# Patient Record
Sex: Male | Born: 1945 | Race: Black or African American | Hispanic: No | Marital: Married | Smoking: Former smoker
Health system: Southern US, Community
[De-identification: ages and names within clinical notes are randomized; demographics above are authoritative.]

## PROBLEM LIST (undated history)

## (undated) DIAGNOSIS — C61 Malignant neoplasm of prostate: Secondary | ICD-10-CM

## (undated) DIAGNOSIS — E785 Hyperlipidemia, unspecified: Secondary | ICD-10-CM

## (undated) DIAGNOSIS — R7303 Prediabetes: Secondary | ICD-10-CM

## (undated) DIAGNOSIS — K219 Gastro-esophageal reflux disease without esophagitis: Secondary | ICD-10-CM

## (undated) DIAGNOSIS — Z973 Presence of spectacles and contact lenses: Secondary | ICD-10-CM

## (undated) HISTORY — PX: BACK SURGERY: SHX140

## (undated) HISTORY — PX: OTHER SURGICAL HISTORY: SHX169

---

## 1995-04-22 HISTORY — PX: BACK SURGERY: SHX140

## 2001-07-19 ENCOUNTER — Encounter: Payer: Self-pay | Admitting: Internal Medicine

## 2001-07-19 ENCOUNTER — Encounter: Admission: RE | Admit: 2001-07-19 | Discharge: 2001-07-19 | Payer: Self-pay | Admitting: Internal Medicine

## 2002-07-01 ENCOUNTER — Ambulatory Visit (HOSPITAL_COMMUNITY): Admission: RE | Admit: 2002-07-01 | Discharge: 2002-07-01 | Payer: Self-pay | Admitting: Gastroenterology

## 2015-04-22 DIAGNOSIS — Z87442 Personal history of urinary calculi: Secondary | ICD-10-CM

## 2015-04-22 DIAGNOSIS — N200 Calculus of kidney: Secondary | ICD-10-CM

## 2015-04-22 DIAGNOSIS — N281 Cyst of kidney, acquired: Secondary | ICD-10-CM

## 2015-04-22 HISTORY — DX: Cyst of kidney, acquired: N28.1

## 2015-04-22 HISTORY — DX: Personal history of urinary calculi: Z87.442

## 2015-04-22 HISTORY — DX: Calculus of kidney: N20.0

## 2015-06-27 ENCOUNTER — Emergency Department (HOSPITAL_COMMUNITY): Payer: Federal, State, Local not specified - PPO

## 2015-06-27 ENCOUNTER — Emergency Department (HOSPITAL_COMMUNITY)
Admission: EM | Admit: 2015-06-27 | Discharge: 2015-06-27 | Disposition: A | Discharge status: Home / Self Care | Payer: Federal, State, Local not specified - PPO | Attending: Emergency Medicine | Admitting: Emergency Medicine

## 2015-06-27 ENCOUNTER — Encounter (HOSPITAL_COMMUNITY): Payer: Self-pay

## 2015-06-27 DIAGNOSIS — Z79899 Other long term (current) drug therapy: Secondary | ICD-10-CM | POA: Diagnosis not present

## 2015-06-27 DIAGNOSIS — Z7982 Long term (current) use of aspirin: Secondary | ICD-10-CM | POA: Diagnosis not present

## 2015-06-27 DIAGNOSIS — R1032 Left lower quadrant pain: Secondary | ICD-10-CM | POA: Diagnosis present

## 2015-06-27 DIAGNOSIS — N200 Calculus of kidney: Secondary | ICD-10-CM | POA: Insufficient documentation

## 2015-06-27 DIAGNOSIS — N23 Unspecified renal colic: Secondary | ICD-10-CM

## 2015-06-27 DIAGNOSIS — N1330 Unspecified hydronephrosis: Secondary | ICD-10-CM | POA: Diagnosis not present

## 2015-06-27 LAB — CBC WITH DIFFERENTIAL/PLATELET
Basophils Absolute: 0 10*3/uL (ref 0.0–0.1)
Basophils Relative: 0 %
EOS ABS: 0 10*3/uL (ref 0.0–0.7)
EOS PCT: 0 %
HCT: 47.4 % (ref 39.0–52.0)
Hemoglobin: 15.4 g/dL (ref 13.0–17.0)
LYMPHS ABS: 1.6 10*3/uL (ref 0.7–4.0)
LYMPHS PCT: 11 %
MCH: 29.9 pg (ref 26.0–34.0)
MCHC: 32.5 g/dL (ref 30.0–36.0)
MCV: 92 fL (ref 78.0–100.0)
MONO ABS: 1.1 10*3/uL — AB (ref 0.1–1.0)
MONOS PCT: 7 %
Neutro Abs: 12.7 10*3/uL — ABNORMAL HIGH (ref 1.7–7.7)
Neutrophils Relative %: 82 %
PLATELETS: 210 10*3/uL (ref 150–400)
RBC: 5.15 MIL/uL (ref 4.22–5.81)
RDW: 14.6 % (ref 11.5–15.5)
WBC: 15.5 10*3/uL — AB (ref 4.0–10.5)

## 2015-06-27 LAB — I-STAT CG4 LACTIC ACID, ED
LACTIC ACID, VENOUS: 5.22 mmol/L — AB (ref 0.5–2.0)
Lactic Acid, Venous: 2.96 mmol/L (ref 0.5–2.0)

## 2015-06-27 LAB — BASIC METABOLIC PANEL
ANION GAP: 11 (ref 5–15)
BUN: 18 mg/dL (ref 6–20)
CO2: 22 mmol/L (ref 22–32)
Calcium: 9.3 mg/dL (ref 8.9–10.3)
Chloride: 107 mmol/L (ref 101–111)
Creatinine, Ser: 1.75 mg/dL — ABNORMAL HIGH (ref 0.61–1.24)
GFR calc Af Amer: 44 mL/min — ABNORMAL LOW (ref >60)
GFR, EST NON AFRICAN AMERICAN: 38 mL/min — AB (ref >60)
Glucose, Bld: 170 mg/dL — ABNORMAL HIGH (ref 65–99)
POTASSIUM: 3.8 mmol/L (ref 3.5–5.1)
SODIUM: 140 mmol/L (ref 135–145)

## 2015-06-27 LAB — PROTIME-INR
INR: 1 (ref 0.00–1.49)
PROTHROMBIN TIME: 13.4 s (ref 11.6–15.2)

## 2015-06-27 LAB — URINALYSIS, ROUTINE W REFLEX MICROSCOPIC
Bilirubin Urine: NEGATIVE
Glucose, UA: NEGATIVE mg/dL
Ketones, ur: NEGATIVE mg/dL
LEUKOCYTES UA: NEGATIVE
Nitrite: NEGATIVE
PROTEIN: NEGATIVE mg/dL
Specific Gravity, Urine: 1.036 — ABNORMAL HIGH (ref 1.005–1.030)
pH: 5 (ref 5.0–8.0)

## 2015-06-27 LAB — URINE MICROSCOPIC-ADD ON

## 2015-06-27 MED ORDER — FENTANYL CITRATE (PF) 100 MCG/2ML IJ SOLN
50.0000 ug | Freq: Once | INTRAMUSCULAR | Status: AC
  Administered 2015-06-27: 50 ug via INTRAVENOUS
  Filled 2015-06-27: qty 2

## 2015-06-27 MED ORDER — ONDANSETRON HCL 4 MG/2ML IJ SOLN
4.0000 mg | Freq: Once | INTRAMUSCULAR | Status: AC
  Administered 2015-06-27: 4 mg via INTRAVENOUS
  Filled 2015-06-27: qty 2

## 2015-06-27 MED ORDER — IOHEXOL 300 MG/ML  SOLN
25.0000 mL | Freq: Once | INTRAMUSCULAR | Status: AC | PRN
  Administered 2015-06-27: 25 mL via ORAL

## 2015-06-27 MED ORDER — SODIUM CHLORIDE 0.9 % IV BOLUS (SEPSIS): 1000.0000 mL | INTRAVENOUS | Status: AC

## 2015-06-27 MED ORDER — SODIUM CHLORIDE 0.9 % IV BOLUS (SEPSIS)
1000.0000 mL | Freq: Once | INTRAVENOUS | Status: AC
  Administered 2015-06-27: 1000 mL via INTRAVENOUS

## 2015-06-27 MED ORDER — TAMSULOSIN HCL 0.4 MG PO CAPS: 0.4000 mg | ORAL_CAPSULE | Freq: Every day | ORAL | Status: DC

## 2015-06-27 MED ORDER — HYDROCODONE-ACETAMINOPHEN 5-325 MG PO TABS: 1.0000 | ORAL_TABLET | Freq: Four times a day (QID) | ORAL | Status: DC | PRN

## 2015-06-27 MED ORDER — SODIUM CHLORIDE 0.9 % IV BOLUS (SEPSIS): 500.0000 mL | INTRAVENOUS | Status: AC

## 2015-06-27 MED ORDER — ONDANSETRON 8 MG PO TBDP: 8.0000 mg | ORAL_TABLET | Freq: Three times a day (TID) | ORAL | Status: DC | PRN

## 2015-06-27 MED ORDER — NAPROXEN 500 MG PO TABS: 500.0000 mg | ORAL_TABLET | Freq: Two times a day (BID) | ORAL | Status: DC

## 2015-06-27 MED ORDER — IOHEXOL 300 MG/ML  SOLN
80.0000 mL | Freq: Once | INTRAMUSCULAR | Status: AC | PRN
  Administered 2015-06-27: 80 mL via INTRAVENOUS

## 2015-06-27 MED ORDER — KETOROLAC TROMETHAMINE 15 MG/ML IJ SOLN
15.0000 mg | Freq: Once | INTRAMUSCULAR | Status: AC
  Administered 2015-06-27: 15 mg via INTRAVENOUS
  Filled 2015-06-27: qty 1

## 2015-06-27 NOTE — ED Notes (Signed)
EDP CANCELLED CODE SEPSIS

## 2015-06-27 NOTE — ED Provider Notes (Addendum)
CSN: [id number]     Arrival date & time 06/27/15  0700 History   First MD Initiated Contact with Patient 06/27/15 [phone]     Chief Complaint  Patient presents with  . Flank Pain     (Consider location/radiation/quality/duration/timing/severity/associated sxs/prior Treatment) HPI Comments: 70 y.o male with no significant medical history comes in with cc of abdominal pain and back pain. Pt's pain started at 5 am. Pain is dull, but constant and moderately severe. Pain is located in the LLQ and radiates to the back. No pain in the groin/scrotal region. No hx of similar pain, no hx of renal stones. Pt denies diarrhea, uti like sx. + nausea and emesis. No hx of diverticular dz that we are aware of.   ROS 10 Systems reviewed and are negative for acute change except as noted in the HPI.     Patient is a 70 y.o. male presenting with flank pain. The history is provided by the patient.  Flank Pain    History reviewed. No pertinent past medical history. History reviewed. No pertinent past surgical history. History reviewed. No pertinent family history. Social History  Substance Use Topics  . Smoking status: Never Smoker   . Smokeless tobacco: None  . Alcohol Use: No    Review of Systems  Genitourinary: Positive for flank pain.  All other systems reviewed and are negative.     Allergies  Review of patient's allergies indicates no known allergies.  Home Medications   Prior to Admission medications   Medication Sig Start Date End Date Taking? Authorizing Provider  aspirin EC 325 MG tablet Take 325 mg by mouth daily.   Yes Historical Provider, MD  atorvastatin (LIPITOR) 40 MG tablet Take 40 mg by mouth daily. 06/18/15  Yes Historical Provider, MD  naproxen sodium (ANAPROX) 220 MG tablet Take 440 mg by mouth 2 (two) times daily as needed (pain).   Yes Historical Provider, MD  psyllium (METAMUCIL) 58.6 % packet Take 1 packet by mouth daily.   Yes Historical Provider, MD  Saw Palmetto,  Serenoa repens, (SAW PALMETTO PO) Take 2 tablets by mouth daily.   Yes Historical Provider, MD  HYDROcodone-acetaminophen (NORCO/VICODIN) 5-325 MG tablet Take 1 tablet by mouth every 6 (six) hours as needed. 06/27/15   Varney Biles, MD  naproxen (NAPROSYN) 500 MG tablet Take 1 tablet (500 mg total) by mouth 2 (two) times daily with a meal. 06/27/15   Varney Biles, MD  ondansetron (ZOFRAN ODT) 8 MG disintegrating tablet Take 1 tablet (8 mg total) by mouth every 8 (eight) hours as needed for nausea. 06/27/15   Varney Biles, MD  tamsulosin (FLOMAX) 0.4 MG CAPS capsule Take 1 capsule (0.4 mg total) by mouth daily. 06/27/15   Williemae Muriel Kathrynn Humble, MD   BP 127/62 mmHg  Pulse 83  Temp(Src) 97.8 F (36.6 C) (Oral)  Resp 20  Ht 5\' 11"  (1.803 m)  Wt 178 lb (80.74 kg)  BMI 24.84 kg/m2  SpO2 97% Physical Exam  Constitutional: He is oriented to person, place, and time. He appears well-developed.  HENT:  Head: Normocephalic and atraumatic.  Eyes: Conjunctivae and EOM are normal. Pupils are equal, round, and reactive to light.  Neck: Normal range of motion. Neck supple.  Cardiovascular: Normal rate, regular rhythm and normal heart sounds.   Pulmonary/Chest: Effort normal and breath sounds normal. No respiratory distress. He has no wheezes.  Abdominal: Soft. Bowel sounds are normal. He exhibits no distension. There is tenderness. There is guarding. There is no rebound.  LLQ tenderness  Genitourinary:  Testes descended and free moving, no hernia  Neurological: He is alert and oriented to person, place, and time.  Skin: Skin is warm.  Nursing note and vitals reviewed.   ED Course  Procedures (including critical care time) Labs Review Labs Reviewed  CBC WITH DIFFERENTIAL/PLATELET - Abnormal; Notable for the following:    WBC 15.5 (*)    Neutro Abs 12.7 (*)    Monocytes Absolute 1.1 (*)    All other components within normal limits  BASIC METABOLIC PANEL - Abnormal; Notable for the following:     Glucose, Bld 170 (*)    Creatinine, Ser 1.75 (*)    GFR calc non Af Amer 38 (*)    GFR calc Af Amer 44 (*)    All other components within normal limits  URINALYSIS, ROUTINE W REFLEX MICROSCOPIC (NOT AT [facility]) - Abnormal; Notable for the following:    Specific Gravity, Urine 1.036 (*)    Hgb urine dipstick LARGE (*)    All other components within normal limits  URINE MICROSCOPIC-ADD ON - Abnormal; Notable for the following:    Squamous Epithelial / LPF 0-5 (*)    Bacteria, UA RARE (*)    All other components within normal limits  I-STAT CG4 LACTIC ACID, ED - Abnormal; Notable for the following:    Lactic Acid, Venous 5.22 (*)    All other components within normal limits  I-STAT CG4 LACTIC ACID, ED - Abnormal; Notable for the following:    Lactic Acid, Venous 2.96 (*)    All other components within normal limits  CULTURE, BLOOD (ROUTINE X 2)  CULTURE, BLOOD (ROUTINE X 2)  URINE CULTURE  PROTIME-INR    Imaging Review Ct Abdomen Pelvis W Contrast  06/27/2015  CLINICAL DATA:  Abdominal pain for several hours, initial encounter EXAM: CT ABDOMEN AND PELVIS WITH CONTRAST TECHNIQUE: Multidetector CT imaging of the abdomen and pelvis was performed using the standard protocol following bolus administration of intravenous contrast. CONTRAST:  67mL OMNIPAQUE IOHEXOL 300 MG/ML SOLN, 6mL OMNIPAQUE IOHEXOL 300 MG/ML SOLN COMPARISON:  None. FINDINGS: Lung bases are well aerated with minimal scarring in the left lung base. The liver shows a large central hypodensity measuring 3.5 cm consistent with a simple cyst. The spleen, gallbladder, adrenal glands and pancreas are within normal limits. Kidneys are well visualized bilaterally and reveal renal cystic change on the right. A tiny nonobstructing stone is noted in the right lower pole measuring 1-2 mm. The left kidney demonstrates delayed enhancement with evidence of hydronephrosis and hydroureter extending to the level of the left ureterovesical junction.  There is a 5-6 mm stone identified causing the obstructive change. This is best visualized on image number 81 of series 2. A small lower pole cyst is noted on the left as well. The appendix is within normal limits. The bladder is otherwise well distended. Prostatic calcifications are seen. No gross bony abnormality is noted. IMPRESSION: Hepatic and renal cysts. Left UVJ stone as described with obstructive change. Tiny nonobstructing stone on the right. Electronically Signed   By: Inez Catalina M.D.   On: 06/27/2015 09:04   Dg Chest Port 1 View  06/27/2015  CLINICAL DATA:  New onset severe left mid abdominal pain. EXAM: PORTABLE CHEST 1 VIEW COMPARISON:  None. FINDINGS: The heart size and mediastinal contours are within normal limits. Both lungs are clear. The visualized skeletal structures are unremarkable. IMPRESSION: No active disease. Electronically Signed   By: Rolm Baptise M.D.  On: 06/27/2015 07:55   I have personally reviewed and evaluated these images and lab results as part of my medical decision-making.   EKG Interpretation None      MDM   Final diagnoses:  Ureteral colic  Hydronephrosis of left kidney  Renal stone    Pt comes in with new and sudden onset abd pain, LLW, radiating to the flank region. Exam shoes guarding. Pt has 0 SIRs at arrival. DDX: Diverticulitis, intra-abd abscess, Renal stones. Pt has guarding and looks quite uncomfortable - upright chest ordered to r/o free air. Pt will need CT scan of the abd.   Varney Biles, MD 06/27/15 [id number]  @9 :45 am: Pt's CT shows UVJ obstructive stone. UA is pending. Pt has no UTI like symptoms. The lactate is > 4 - but this really doesn't appear to be septic shock. Lactate probably elevated due to the acute process from the stone. WE WILL NOT GIVE ANTIBIOTICS unless we have UTI. Fluids and cultures done and code sepsis activated - but we dont think pt is in septic shock. 2 SIRs-  RR and elevated WC. BP and HR are normal. Pt's  pain is in relative control. He is s/p fentanyl and toradol 1 round. Nausea is well controlled. Repeat lactate ordered. Will be discharged home if the lactate clears and pain is control. If admitted - he will be admitted for intractible stone.  Varney Biles, MD 06/27/15 0951   1:35 PM Multiple reassessments - pain improved with fentanyl, resolved post toradol. Lactate was > 5, on repeat, post 1 liter of ivf it has cleared. UA is clean. Lactate is not due to septic shock, hypovolemia, hemorrhage. Pt passed po challenge. Will d/c. Strict return precautions discussed.    Varney Biles, MD 06/27/15 1336

## 2015-06-27 NOTE — Discharge Instructions (Signed)
We saw you in the ER for the abdominal pain. Our results indicate that you have a kidney stone. We were able to get your pain is relative control, and we can safely send you home.  Take the meds prescribed. Set up an appointment with the Urologist. If the pain is unbearable, you start having fevers, chills, and are unable to keep any meds down - then return to the ER.  Renal Colic Renal colic is pain that is caused by passing a kidney stone. The pain can be sharp and severe. It may be felt in the back, abdomen, side (flank), or groin. It can cause nausea. Renal colic can come and go. HOME CARE INSTRUCTIONS Watch your condition for any changes. The following actions may help to lessen any discomfort that you are feeling:  Take medicines only as directed by your health care provider.  Ask your health care provider if it is okay to take over-the-counter pain medicine.  Drink enough fluid to keep your urine clear or pale yellow. Drink 6-8 glasses of water each day.  Limit the amount of salt that you eat to less than 2 grams per day.  Reduce the amount of protein in your diet. Eat less meat, fish, nuts, and dairy.  Avoid foods such as spinach, rhubarb, nuts, or bran. These may make kidney stones more likely to form. SEEK MEDICAL CARE IF:  You have a fever or chills.  Your urine smells bad or looks cloudy.  You have pain or burning when you pass urine. SEEK IMMEDIATE MEDICAL CARE IF:  Your flank pain or groin pain suddenly worsens.  You become confused or disoriented or you lose consciousness.   This information is not intended to replace advice given to you by your health care provider. Make sure you discuss any questions you have with your health care provider.   Document Released: 01/15/2005 Document Revised: 04/28/2014 Document Reviewed: 02/15/2014 Elsevier Interactive Patient Education [employer].

## 2015-06-27 NOTE — ED Notes (Signed)
Lab delay Pt in CT

## 2015-06-27 NOTE — ED Notes (Signed)
Pt is able to urinate. No in and out at this time

## 2015-06-27 NOTE — ED Notes (Signed)
MD at bedside. EDP PRESENT 

## 2015-06-27 NOTE — ED Notes (Signed)
Author spoke with Dr. Kathrynn Humble following call from [place] regarding starting antibiotics for patient. Per Dr. Kathrynn Humble antibiotics will not be started at this time.

## 2015-06-27 NOTE — ED Notes (Signed)
RN [facility] CHARGE AND JAKEEMA RN, JOY L NT PRESENT ASSISTED WITH CODE SEPSIS. EDP AWARE. PT AND FAMILY AWARE.

## 2015-06-27 NOTE — ED Notes (Addendum)
[facility]PA CRITICAL CARE at bedside.

## 2015-06-27 NOTE — ED Notes (Signed)
Pt complains of left flank pain since 4am, he states that he vomited once, no trouble with urination

## 2015-06-27 NOTE — ED Notes (Signed)
Patient transported to CT 

## 2015-06-28 LAB — URINE CULTURE: CULTURE: NO GROWTH

## 2015-07-02 LAB — CULTURE, BLOOD (ROUTINE X 2)
CULTURE: NO GROWTH
CULTURE: NO GROWTH

## 2020-08-01 HISTORY — PX: CYSTOSCOPY: SUR368

## 2020-10-09 ENCOUNTER — Other Ambulatory Visit: Payer: Self-pay | Admitting: Internal Medicine

## 2020-10-10 ENCOUNTER — Other Ambulatory Visit: Payer: Self-pay | Admitting: Internal Medicine

## 2020-10-10 DIAGNOSIS — R6889 Other general symptoms and signs: Secondary | ICD-10-CM

## 2020-10-29 ENCOUNTER — Ambulatory Visit
Admission: RE | Admit: 2020-10-29 | Discharge: 2020-10-29 | Disposition: A | Discharge status: Home / Self Care | Payer: Federal, State, Local not specified - PPO | Source: Ambulatory Visit | Attending: Internal Medicine | Admitting: Internal Medicine

## 2020-10-29 ENCOUNTER — Other Ambulatory Visit: Payer: Self-pay

## 2020-10-29 DIAGNOSIS — R6889 Other general symptoms and signs: Secondary | ICD-10-CM

## 2020-10-29 MED ORDER — GADOBENATE DIMEGLUMINE 529 MG/ML IV SOLN
16.0000 mL | Freq: Once | INTRAVENOUS | Status: AC | PRN
  Administered 2020-10-29: 16 mL via INTRAVENOUS

## 2021-01-22 ENCOUNTER — Other Ambulatory Visit: Payer: Self-pay | Admitting: Physician Assistant

## 2021-01-22 ENCOUNTER — Other Ambulatory Visit: Payer: Self-pay | Admitting: Gastroenterology

## 2021-01-22 DIAGNOSIS — K769 Liver disease, unspecified: Secondary | ICD-10-CM

## 2021-01-29 ENCOUNTER — Other Ambulatory Visit: Payer: Federal, State, Local not specified - PPO

## 2021-01-29 DIAGNOSIS — R350 Frequency of micturition: Secondary | ICD-10-CM

## 2021-01-29 HISTORY — DX: Frequency of micturition: R35.0

## 2021-01-30 ENCOUNTER — Ambulatory Visit
Admission: RE | Admit: 2021-01-30 | Discharge: 2021-01-30 | Disposition: A | Discharge status: Home / Self Care | Payer: Federal, State, Local not specified - PPO | Source: Ambulatory Visit | Attending: Physician Assistant | Admitting: Physician Assistant

## 2021-01-30 DIAGNOSIS — K769 Liver disease, unspecified: Secondary | ICD-10-CM

## 2021-03-21 DIAGNOSIS — Z8616 Personal history of COVID-19: Secondary | ICD-10-CM

## 2021-03-21 HISTORY — DX: Personal history of COVID-19: Z86.16

## 2021-04-01 DIAGNOSIS — N529 Male erectile dysfunction, unspecified: Secondary | ICD-10-CM

## 2021-04-01 DIAGNOSIS — N138 Other obstructive and reflux uropathy: Secondary | ICD-10-CM

## 2021-04-01 DIAGNOSIS — R3129 Other microscopic hematuria: Secondary | ICD-10-CM

## 2021-04-01 DIAGNOSIS — R972 Elevated prostate specific antigen [PSA]: Secondary | ICD-10-CM

## 2021-04-01 DIAGNOSIS — N401 Enlarged prostate with lower urinary tract symptoms: Secondary | ICD-10-CM

## 2021-04-01 HISTORY — DX: Elevated prostate specific antigen (PSA): R97.20

## 2021-04-01 HISTORY — DX: Male erectile dysfunction, unspecified: N52.9

## 2021-04-01 HISTORY — DX: Other microscopic hematuria: R31.29

## 2021-04-01 HISTORY — DX: Other obstructive and reflux uropathy: N13.8

## 2021-04-01 HISTORY — PX: OTHER SURGICAL HISTORY: SHX169

## 2021-04-01 HISTORY — DX: Other obstructive and reflux uropathy: N40.1

## 2021-04-09 ENCOUNTER — Other Ambulatory Visit (HOSPITAL_COMMUNITY): Payer: Self-pay | Admitting: Urology

## 2021-04-09 ENCOUNTER — Other Ambulatory Visit: Payer: Self-pay | Admitting: Urology

## 2021-04-09 DIAGNOSIS — C61 Malignant neoplasm of prostate: Secondary | ICD-10-CM

## 2021-04-24 ENCOUNTER — Other Ambulatory Visit: Payer: Self-pay

## 2021-04-24 ENCOUNTER — Encounter (HOSPITAL_COMMUNITY)
Admission: RE | Admit: 2021-04-24 | Discharge: 2021-04-24 | Disposition: A | Discharge status: Home / Self Care | Payer: Federal, State, Local not specified - PPO | Source: Ambulatory Visit | Attending: Urology | Admitting: Urology

## 2021-04-24 DIAGNOSIS — C61 Malignant neoplasm of prostate: Secondary | ICD-10-CM | POA: Diagnosis not present

## 2021-04-24 MED ORDER — TECHNETIUM TC 99M MEDRONATE IV KIT
20.0000 | PACK | Freq: Once | INTRAVENOUS | Status: AC | PRN
  Administered 2021-04-24: 20 via INTRAVENOUS

## 2021-04-24 NOTE — Progress Notes (Incomplete)
Radiation Oncology         [phone]) [phone] ________________________________  Initial Outpatient Consultation  Name: Johnny Howard MRN: [id number]  Date: 05/07/2021  DOB: 09/01/1945  [place]:GURKYHC, Carloyn Manner, MD  Janith Lima, MD   REFERRING PHYSICIAN: Janith Lima, MD  DIAGNOSIS: 76 y.o. gentleman with Stage T1c adenocarcinoma of the prostate with Gleason score of 4+3, and PSA of 5.57.    ICD-10-CM   1. Malignant neoplasm of prostate ([place])  C61       HISTORY OF PRESENT ILLNESS: Johnny Howard is a 76 y.o. male with a diagnosis of prostate cancer. He was initially seen by Dr. Karsten Ro for a markedly elevated PSA of 21.79 on 03/01/13 with normal DRE and family history of father with prostate cancer, but, TRUS/Bx on 04/25/13 of a 59 gm gland revealed BPH.  His PSA normalized and he was followed with the following screening PSA levels:   Accordingly, he was discussed the PSA in urology by Dr. Abner Greenspan, and the patient proceeded to transrectal ultrasound with 12 biopsies of the prostate on 04/01/21.  The prostate volume measured 69 cc.  Out of 12 core biopsies, 2 were positive.  The maximum Gleason score was 4+3, and this was seen in 10% of the right apex and right lateral apex.  The patient reviewed the biopsy results with his urologist and he has kindly been referred today for discussion of potential radiation treatment options.   PREVIOUS RADIATION THERAPY: {EXAM; YES/NO:19492::"No"}  PAST MEDICAL HISTORY: No past medical history on file.    PAST SURGICAL HISTORY:No past surgical history on file.  FAMILY HISTORY: No family history on file.  SOCIAL HISTORY:  Social History   Socioeconomic History   Marital status: Married    Spouse name: Not on file   Number of children: Not on file   Years of education: Not on file   Highest education level: Not on file  Occupational History   Not on file  Tobacco Use   Smoking status: Never   Smokeless tobacco: Not on file  Substance and Sexual  Activity   Alcohol use: No   Drug use: Not on file   Sexual activity: Not on file  Other Topics Concern   Not on file  Social History Narrative   Not on file   Social Determinants of Health   Financial Resource Strain: Not on file  Food Insecurity: Not on file  Transportation Needs: Not on file  Physical Activity: Not on file  Stress: Not on file  Social Connections: Not on file  Intimate Partner Violence: Not on file    ALLERGIES: Patient has no known allergies.  MEDICATIONS:  Current Outpatient Medications  Medication Sig Dispense Refill   aspirin EC 325 MG tablet Take 325 mg by mouth daily.     atorvastatin (LIPITOR) 40 MG tablet Take 40 mg by mouth daily.  5   HYDROcodone-acetaminophen (NORCO/VICODIN) 5-325 MG tablet Take 1 tablet by mouth every 6 (six) hours as needed. 12 tablet 0   naproxen (NAPROSYN) 500 MG tablet Take 1 tablet (500 mg total) by mouth 2 (two) times daily with a meal. 10 tablet 0   naproxen sodium (ANAPROX) 220 MG tablet Take 440 mg by mouth 2 (two) times daily as needed (pain).     ondansetron (ZOFRAN ODT) 8 MG disintegrating tablet Take 1 tablet (8 mg total) by mouth every 8 (eight) hours as needed for nausea. 20 tablet 0   psyllium (METAMUCIL) 58.6 % packet Take  1 packet by mouth daily.     Saw Palmetto, Serenoa repens, (SAW PALMETTO PO) Take 2 tablets by mouth daily.     tamsulosin (FLOMAX) 0.4 MG CAPS capsule Take 1 capsule (0.4 mg total) by mouth daily. 10 capsule 0   No current facility-administered medications for this visit.    REVIEW OF SYSTEMS:  On review of systems, the patient reports that he is doing well overall. He denies any chest pain, shortness of breath, cough, fevers, chills, night sweats, unintended weight changes. He denies any bowel disturbances, and denies abdominal pain, nausea or vomiting. He denies any new musculoskeletal or joint aches or pains. His IPSS was  , indicating *** urinary symptoms. His  , indicating he {does not  have/has mild/moderate/severe} erectile dysfunction. A complete review of systems is obtained and is otherwise negative.   PHYSICAL EXAM:  Wt Readings from Last 3 Encounters:  06/27/15 178 lb (80.7 kg)   Temp Readings from Last 3 Encounters:  06/27/15 98.9 F (37.2 C) (Oral)   BP Readings from Last 3 Encounters:  06/27/15 118/63   Pulse Readings from Last 3 Encounters:  06/27/15 83    /10  In general this is a well appearing gentleman in no acute distress. He's alert and oriented x4 and appropriate throughout the examination. Cardiopulmonary assessment is negative for acute distress, and he exhibits normal effort.    KPS = ***  100 - Normal; no complaints; no evidence of disease. 90   - Able to carry on normal activity; minor signs or symptoms of disease. 80   - Normal activity with effort; some signs or symptoms of disease. 20   - Cares for self; unable to carry on normal activity or to do active work. 60   - Requires occasional assistance, but is able to care for most of his personal needs. 50   - Requires considerable assistance and frequent medical care. 59   - Disabled; requires special care and assistance. 41   - Severely disabled; hospital admission is indicated although death not imminent. 64   - Very sick; hospital admission necessary; active supportive treatment necessary. 10   - Moribund; fatal processes progressing rapidly. 0     - Dead  Karnofsky DA, Abelmann [place], Craver LS and Burchenal [facility] [phone]) The use of the nitrogen mustards in the palliative treatment of carcinoma: with particular reference to bronchogenic carcinoma Cancer 1 634-56  LABORATORY DATA:  Lab Results  Component Value Date   WBC 15.5 (H) 06/27/2015   HGB 15.4 06/27/2015   HCT 47.4 06/27/2015   MCV 92.0 06/27/2015   PLT 210 06/27/2015   Lab Results  Component Value Date   NA 140 06/27/2015   K 3.8 06/27/2015   CL 107 06/27/2015   CO2 22 06/27/2015   No results found for: ALT, AST, GGT,  ALKPHOS, BILITOT   RADIOGRAPHY: No results found.    IMPRESSION/PLAN: 1. 76 y.o. gentleman with Stage T1c adenocarcinoma of the prostate with Gleason score of 4+3, and PSA of 5.57. We discussed the patient's workup and outlined the nature of prostate cancer in this setting. The patient's T stage, Gleason's score, and PSA put him into the Unfavorable Intermediate risk group. Accordingly, he is eligible for a variety of potential treatment options including brachytherapy, ADT in combination with 5.5 weeks of external radiation, or prostatectomy. We discussed the available radiation techniques, and focused on the details and logistics of delivery. The patient may not be an ideal candidate for brachytherapy  with a prostate volume of 69 cc prior to downsizing from hormone therapy. We discussed that based on his prostate volume, he would require beginning treatment with a 5 alpha reductase inhibitor and ADT for at least 3 months to allow for downsizing of the prostate prior to initiating radiotherapy. We discussed and outlined the risks, benefits, short and long-term effects associated with radiotherapy and compared and contrasted these with prostatectomy. We discussed the role of SpaceOAR gel in reducing the rectal toxicity associated with radiotherapy. We also detailed the role of ADT in the treatment of Unfavorable Intermediate risk prostate cancer and outlined the associated side effects that could be expected with this therapy.  He appears to have a good understanding of his disease and our treatment recommendations which are of curative intent.  He was encouraged to ask questions that were answered to his stated satisfaction.  At the conclusion of our conversation, the patient is interested in moving forward with ***.  We personally spent *** minutes in this encounter including chart review, reviewing radiological studies, meeting face-to-face with the patient, entering orders and completing  documentation.      Tyler Pita, MD  [facility]   Radiation Oncology Direct Dial: [phone]   Fax: [phone] [email or web address]   Skype   LinkedIn

## 2021-04-29 NOTE — Progress Notes (Incomplete)
GU Location of Tumor / Histology: Prostate Ca  If Prostate Cancer, Gleason Score is (4 + 3) and PSA is (5.57 as of 10/22)  Biopsies: Dr. Abner Greenspan        Past/Anticipated interventions by urology, if any:     Weight changes, if any:   IPSS: SHIM:  Bowel/Bladder complaints, if any:    Nausea/Vomiting, if any:   Pain issues, if any:    SAFETY ISSUES: Prior radiation?  Pacemaker/ICD?  Possible current pregnancy?  Male Is the patient on methotrexate?  No  Current Complaints / other details:  Learn more about treatment options.

## 2021-05-01 ENCOUNTER — Encounter: Payer: Self-pay | Admitting: Radiation Oncology

## 2021-05-06 ENCOUNTER — Telehealth: Payer: Self-pay

## 2021-05-06 NOTE — Progress Notes (Signed)
Radiation Oncology         [phone] ________________________________  Initial Outpatient Consultation - Conducted via [facility] face-to-face video visit due to current COVID-19 concerns for limiting patient exposure   Name: Johnny Howard MRN: [id number]  Date: 05/07/2021  DOB: 02/16/1946  VP:[id number], Carloyn Manner, MD  Janith Lima, MD   REFERRING PHYSICIAN: Janith Lima, MD  DIAGNOSIS: 76 y.o. gentleman with Stage T1c adenocarcinoma of the prostate with Gleason score of 4+3, and PSA of 5.57.    ICD-10-CM   1. Malignant neoplasm of prostate ([place])  C61       HISTORY OF PRESENT ILLNESS: Johnny Howard is a 76 y.o. male with a diagnosis of prostate cancer. He was initially seen by Dr. Karsten Ro for a markedly elevated PSA of 21.79 on 03/01/13 with normal DRE and family history of father with prostate cancer, but, TRUS/Bx on 04/25/13 of a 59 gm gland revealed BPH.  His PSA normalized and he was followed with the following screening PSA levels:   Accordingly, he was discussed the PSA in urology by Dr. Abner Greenspan, and the patient proceeded to transrectal ultrasound with 12 biopsies of the prostate on 04/01/21.  The prostate volume measured 69 cc.  Out of 12 core biopsies, 2 were positive.  The maximum Gleason score was 4+3, and this was seen in 10% of the right apex and right lateral apex.  The patient reviewed the biopsy results with his urologist and he has kindly been referred today for discussion of potential radiation treatment options.  He was originally scheduled to see me in person, but, he contracted COVID and is currently quarantining, so, we converted to a face-to-face video visit.   PREVIOUS RADIATION THERAPY: No  PAST MEDICAL HISTORY:  Past Medical History:  Diagnosis Date   BPH with obstruction/lower urinary tract symptoms 04/01/2021   ED (erectile dysfunction) 04/01/2021   due to arterial insuffiency   Elevated PSA 04/01/2021   GERD (gastroesophageal reflux disease)     Hyperlipidemia    Microscopic hematuria 04/01/2021   Renal calculus, right 2017   Renal cyst 2017   Urinary frequency 01/29/2021      PAST SURGICAL HISTORY: Past Surgical History:  Procedure Laterality Date   BACK SURGERY     CYSTOSCOPY  08/01/2020   Prostate Needle Biospsy  04/01/2021   also 2015    FAMILY HISTORY: No family history on file.  SOCIAL HISTORY:  Social History   Socioeconomic History   Marital status: Married    Spouse name: Not on file   Number of children: Not on file   Years of education: Not on file   Highest education level: Not on file  Occupational History   Not on file  Tobacco Use   Smoking status: Former    Types: Cigarettes   Smokeless tobacco: Not on file  Substance and Sexual Activity   Alcohol use: Yes    Comment: Caffeine use   Drug use: Not on file   Sexual activity: Not on file  Other Topics Concern   Not on file  Social History Narrative   Not on file   Social Determinants of Health   Financial Resource Strain: Not on file  Food Insecurity: Not on file  Transportation Needs: Not on file  Physical Activity: Not on file  Stress: Not on file  Social Connections: Not on file  Intimate Partner Violence: Not on file    ALLERGIES: Patient has no known allergies.  MEDICATIONS:  Current Outpatient  Medications  Medication Sig Dispense Refill   atorvastatin (LIPITOR) 40 MG tablet Take 40 mg by mouth daily.  5   psyllium (METAMUCIL) 58.6 % packet Take 1 packet by mouth daily.     sildenafil (VIAGRA) 50 MG tablet 60MG  1 tablet as needed     aspirin EC 325 MG tablet Take 325 mg by mouth daily. (Patient not taking: Reported on 05/07/2021)     HYDROcodone-acetaminophen (NORCO/VICODIN) 5-325 MG tablet Take 1 tablet by mouth every 6 (six) hours as needed. (Patient not taking: Reported on 05/07/2021) 12 tablet 0   naproxen (NAPROSYN) 500 MG tablet Take 1 tablet (500 mg total) by mouth 2 (two) times daily with a meal. (Patient not taking:  Reported on 05/07/2021) 10 tablet 0   naproxen sodium (ANAPROX) 220 MG tablet Take 440 mg by mouth 2 (two) times daily as needed (pain). (Patient not taking: Reported on 05/07/2021)     ondansetron (ZOFRAN ODT) 8 MG disintegrating tablet Take 1 tablet (8 mg total) by mouth every 8 (eight) hours as needed for nausea. (Patient not taking: Reported on 05/07/2021) 20 tablet 0   Saw Palmetto, Serenoa repens, (SAW PALMETTO PO) Take 2 tablets by mouth daily. (Patient not taking: Reported on 05/07/2021)     tamsulosin (FLOMAX) 0.4 MG CAPS capsule Take 1 capsule (0.4 mg total) by mouth daily. (Patient not taking: Reported on 05/07/2021) 10 capsule 0   No current facility-administered medications for this encounter.    REVIEW OF SYSTEMS:  On review of systems, the patient reports that he is doing well overall. He denies any chest pain, shortness of breath, cough, fevers, chills, night sweats, unintended weight changes. He denies any bowel disturbances, and denies abdominal pain, nausea or vomiting. He denies any new musculoskeletal or joint aches or pains. His IPSS was Total Score: 3, indicating mild urinary symptoms. His SHIM: 20, indicating he does not have severe erectile dysfunction. A complete review of systems is obtained and is otherwise negative.   PHYSICAL EXAM:  Wt Readings from Last 3 Encounters:  05/07/21 171 lb (77.6 kg)  06/27/15 178 lb (80.7 kg)   Temp Readings from Last 3 Encounters:  06/27/15 98.9 F (37.2 C) (Oral)   BP Readings from Last 3 Encounters:  06/27/15 118/63   Pulse Readings from Last 3 Encounters:  06/27/15 83   Pain Assessment Pain Score: 0-No pain/10  In general this is a well appearing gentleman in no acute distress. He's alert and oriented x4 and appropriate throughout the examination. Cardiopulmonary assessment is negative for acute distress, and he exhibits normal effort.    KPS = 100  100 - Normal; no complaints; no evidence of disease. 90   - Able to carry on  normal activity; minor signs or symptoms of disease. 80   - Normal activity with effort; some signs or symptoms of disease. 31   - Cares for self; unable to carry on normal activity or to do active work. 60   - Requires occasional assistance, but is able to care for most of his personal needs. 50   - Requires considerable assistance and frequent medical care. 37   - Disabled; requires special care and assistance. 32   - Severely disabled; hospital admission is indicated although death not imminent. 13   - Very sick; hospital admission necessary; active supportive treatment necessary. 10   - Moribund; fatal processes progressing rapidly. 0     - Dead  Karnofsky DA, Abelmann Marshallberg, Craver LS and Burchenal [facility] (  1948) The use of the nitrogen mustards in the palliative treatment of carcinoma: with particular reference to bronchogenic carcinoma Cancer 1 634-56  LABORATORY DATA:  Lab Results  Component Value Date   WBC 15.5 (H) 06/27/2015   HGB 15.4 06/27/2015   HCT 47.4 06/27/2015   MCV 92.0 06/27/2015   PLT 210 06/27/2015   Lab Results  Component Value Date   NA 140 06/27/2015   K 3.8 06/27/2015   CL 107 06/27/2015   CO2 22 06/27/2015   No results found for: ALT, AST, GGT, ALKPHOS, BILITOT   RADIOGRAPHY: NM Bone Scan Whole Body  Result Date: 04/24/2021 CLINICAL DATA:  Prostate cancer EXAM: NUCLEAR MEDICINE WHOLE BODY BONE SCAN TECHNIQUE: Whole body anterior and posterior images were obtained approximately 3 hours after intravenous injection of radiopharmaceutical. RADIOPHARMACEUTICALS:  22.0 mCi Technetium-18m MDP IV COMPARISON:  04/24/2021 FINDINGS: Anterior and posterior whole body planar images are obtained after radiotracer administration. Physiologic excretion of radiotracer within the kidneys and bladder. Mild uptake at the bilateral shoulders, bilateral wrist, lumbosacral junction, and right knee consistent with degenerative change. No radiotracer uptake to suggest an acute or  destructive process. Specifically, no evidence of bony metastases. IMPRESSION: 1. Mild degenerative changes as above. No evidence of bony metastases. Electronically Signed   By: Randa Ngo M.D.   On: 04/24/2021 20:46      IMPRESSION/PLAN: 1. This visit was conducted via video to since the patient acutally has COVID.  76 y.o. gentleman with Stage T1c adenocarcinoma of the prostate with Gleason score of 4+3, and PSA of 5.57. We discussed the patient's workup and outlined the nature of prostate cancer in this setting. The patient's T stage, Gleason's score, and PSA put him into the Unfavorable Intermediate risk group. Accordingly, he is eligible for a variety of potential treatment options including brachytherapy, ADT in combination with 5.5 weeks of external radiation, or prostatectomy. We discussed the available radiation techniques, and focused on the details and logistics of delivery. The patient may not be an ideal candidate for brachytherapy with a prostate volume of 69 cc prior to downsizing from hormone therapy. We discussed that based on his prostate volume, he would require beginning treatment with a 5 alpha reductase inhibitor and ADT for at least 3 months to allow for downsizing of the prostate prior to initiating radiotherapy. We discussed and outlined the risks, benefits, short and long-term effects associated with radiotherapy and compared and contrasted these with prostatectomy. We discussed the role of SpaceOAR gel in reducing the rectal toxicity associated with radiotherapy. We also detailed the role of ADT in the treatment of Unfavorable Intermediate risk prostate cancer and outlined the associated side effects that could be expected with this therapy.  He appears to have a good understanding of his disease and our treatment recommendations which are of curative intent.  He was encouraged to ask questions that were answered to his stated satisfaction.  At the conclusion of our  conversation, the patient is interested in moving forward with IMRT.  He'll return to Dr. Abner Greenspan next week to finalize his decision. If he decides on IMRT, we'll request gold fiducials and SpaceOAR.  I did not recommend ADT in this case due to relatively low volume disease at this age.  Given current concerns for patient exposure during the COVID-19 pandemic, this encounter was conducted via face-to-face video visit. The patient has given verbal consent for this type of encounter. The time spent during this encounter was 60 minutes in total. The attendants  for this meeting include Tyler Pita MD and the patient, Mr. Kozakiewicz. During the encounter, Tyler Pita MD was located at [facility] Radiation Oncology Department.  Patient was located at home.  I personally spent 60 minutes in this encounter including chart review, reviewing radiological studies, meeting face-to-face with the patient, entering orders and completing documentation.      Tyler Pita, MD  [facility]   Radiation Oncology Direct Dial: [phone]   Fax: [phone] .com   Skype   LinkedIn

## 2021-05-06 NOTE — Telephone Encounter (Signed)
Returned call to Mr. Wheeling about consult visit tomorrow afternoon and that it's been changed from in-person visit to [facility] video visit due to him testing positive for Covid.  He is aware and expressed that his wife will be assisting him on how to get online for tomorrow's visit. No other concerns at this time.

## 2021-05-07 ENCOUNTER — Ambulatory Visit
Admission: RE | Admit: 2021-05-07 | Discharge: 2021-05-07 | Disposition: A | Discharge status: Home / Self Care | Payer: Federal, State, Local not specified - PPO | Source: Ambulatory Visit | Attending: Radiation Oncology | Admitting: Radiation Oncology

## 2021-05-07 ENCOUNTER — Ambulatory Visit: Admission: RE | Admit: 2021-05-07 | Payer: Federal, State, Local not specified - PPO | Source: Ambulatory Visit

## 2021-05-07 VITALS — Ht 70.0 in | Wt 171.0 lb

## 2021-05-07 DIAGNOSIS — Z860100 Personal history of colon polyps, unspecified: Secondary | ICD-10-CM | POA: Insufficient documentation

## 2021-05-07 DIAGNOSIS — C61 Malignant neoplasm of prostate: Secondary | ICD-10-CM

## 2021-05-07 DIAGNOSIS — K769 Liver disease, unspecified: Secondary | ICD-10-CM | POA: Insufficient documentation

## 2021-05-07 HISTORY — DX: Hyperlipidemia, unspecified: E78.5

## 2021-05-07 HISTORY — DX: Gastro-esophageal reflux disease without esophagitis: K21.9

## 2021-05-07 NOTE — Progress Notes (Signed)
GU Location of Tumor / Histology: Prostate Ca  If Prostate Cancer, Gleason Score is (4 + 3) and PSA is (5.57 as of 10/22)  Biopsies  Dr. Abner Greenspan        Past/Anticipated interventions by urology, if any:     Weight changes, if any: No stable  IPSS:  3 SHIM:  20  Bowel/Bladder complaints, if any:  No bladder issues other then frequency because increased water intake.  No bowel is regular with Metamucil.  Nausea/Vomiting, if any: No  Pain issues, if any:  0/10  SAFETY ISSUES: Prior radiation?  No Pacemaker/ICD?  No Possible current pregnancy? Male Is the patient on methotrexate? No  Current Complaints / other details:  Want to learn more about treatment options.

## 2021-05-09 NOTE — Progress Notes (Signed)
Called to introduce myself to patient.  Voicemail left requesting call back.

## 2021-05-09 NOTE — Progress Notes (Signed)
Introduced myself to the patient as the prostate nurse navigator.  No barriers to care identified at this time.  I provided my contact information to patient and encouraged him to call with any questions or barriers that arise.  Verbalized understanding.

## 2021-05-16 NOTE — Progress Notes (Signed)
RN followed up to ensure treatment decision after patient's appointment at [facility].   Patient has decided to proceed with radiation, along with 5 alpha reductase inhibitor and ADT for 3 months for downsizing of his prostate.  Dr. Abner Greenspan notified staff to set up patient for fiducial markers and SpaceOAR.

## 2021-05-27 ENCOUNTER — Other Ambulatory Visit: Payer: Self-pay | Admitting: Urology

## 2021-05-28 ENCOUNTER — Telehealth: Payer: Self-pay | Admitting: *Deleted

## 2021-05-28 NOTE — Telephone Encounter (Signed)
CALLED PATIENT TO INFORM OF FID. MARKER AND SPACE OAR GEL PLACEMENT ON 07-11-21 AND HIS SIM ON 07-15-21- ARRIVAL TIME- 10:45 AM @ [facility], PATIENT TO ARRIVE WITH FULL BLADDER AND EMPTY BOWEL, LVM FOR A RETURN CALL

## 2021-06-06 NOTE — Progress Notes (Signed)
Pt chart reviewed, pt meets [facility] criteria barring any acute status changes.

## 2021-07-08 ENCOUNTER — Encounter (HOSPITAL_BASED_OUTPATIENT_CLINIC_OR_DEPARTMENT_OTHER): Payer: Self-pay | Admitting: Urology

## 2021-07-08 ENCOUNTER — Other Ambulatory Visit: Payer: Self-pay

## 2021-07-08 DIAGNOSIS — R200 Anesthesia of skin: Secondary | ICD-10-CM

## 2021-07-08 DIAGNOSIS — H9190 Unspecified hearing loss, unspecified ear: Secondary | ICD-10-CM

## 2021-07-08 HISTORY — DX: Unspecified hearing loss, unspecified ear: H91.90

## 2021-07-08 HISTORY — DX: Anesthesia of skin: R20.0

## 2021-07-08 NOTE — Progress Notes (Signed)
Spoke w/ via phone for pre-op interview---pt ?Lab needs dos----  ekg, istat             ?Lab results------none ?COVID test -----patient states asymptomatic no test needed ?Arrive at -------730 am 07-11-2021 ?NO Solid Food.  Clear liquids from MN until---630 am ?Med rec completed ?Medications to take morning of surgery -----Atorvastatin ?Diabetic medication -----n/a ?Patient instructed no nail polish to be worn day of surgery ?Patient instructed to bring photo id and insurance card day of surgery ?Patient aware to have Driver (ride ) / caregiver  driver daughter art levan Hansel Feinstein bettie will switch out in waiting room day of surgery  for 24 hours after surgery  ?Patient Special Instructions -----fleets enema am of surgery  ?Pre-Op special Istructions -----none ?Patient verbalized understanding of instructions that were given at this phone interview. ?Patient denies shortness of breath, chest pain, fever, cough at this phone interview.  ?

## 2021-07-10 NOTE — H&P (Signed)
H&P ? ?Chief Complaint: Prostate cancer ? ?History of Present Illness: 76 yo male presents for placement of fiducial markers in advance of EBRT for curative therapy for GG 3 PCa. ? ?Past Medical History:  ?Diagnosis Date  ? BPH with obstruction/lower urinary tract symptoms 04/01/2021  ? ED (erectile dysfunction) 04/01/2021  ? due to arterial insuffiency  ? Elevated PSA 04/01/2021  ? History of COVID-19 03/2021  ? coughing x 2 and 1/2 weeks all symptoms resolved  ? History of kidney stones 07-19-2015  ? passed on own  ? 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 left ear  ? Hyperlipidemia   ? Microscopic hematuria 04/01/2021  ? Numbness of left hand 07/08/2021  ? on occasional per pt no known carpal tunnel  ? Pre-diabetes   ? Prostate cancer ([place])   ? Urinary frequency 01/29/2021  ? Wears glasses for reading   ? ? ?Past Surgical History:  ?Procedure Laterality Date  ? [place]  ? lower back  ? colonscopy    ? 07/18/17 or 07-19-18  ? CYSTOSCOPY  08/01/2020  ? Prostate Needle Biospsy  04/01/2021  ? also 2013/07/18  ? ? ?Home Medications:  ?Allergies as of 07/10/2021   ?No Known Allergies ?  ? ?  ?Medication List  ?  ? ? Notice   ?Cannot display discharge medications because the patient has not yet been admitted. ?  ? ? ?Allergies: No Known Allergies ? ?No family history on file. ? ?Social History:  reports that he quit smoking about 52 years ago. His smoking use included cigarettes. He has a 7.00 pack-year smoking history. He does not have any smokeless tobacco history on file. He reports current alcohol use. He reports that he does not currently use drugs. ? ?ROS: ?A complete review of systems was performed.  All systems are negative except for pertinent findings as noted. ? ?Physical Exam:  ?Vital signs in last 24 hours: ?Ht 5' 10.5" (1.791 m)   Wt 78 kg   BMI 24.33 kg/m?  ?Constitutional:  Alert and oriented, No acute distress ?Cardiovascular: Regular rate  ?Respiratory: Normal respiratory effort ?Psychiatric: Normal mood and  affect ? ?I have reviewed prior pt notes ? ?I have reviewed notes from referring/previous physicians ? ?I have independently reviewed prior imaging ? ?I have reviewed prior PSA/pathology results ? ? 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 ?Plan:  ?Placement of fiducial markers/SpaceOAr ? ?

## 2021-07-10 NOTE — Anesthesia Preprocedure Evaluation (Addendum)
Anesthesia Evaluation  ?Patient identified by MRN, date of birth, ID band ?Patient awake ? ? ? ?Reviewed: ?Allergy & Precautions, NPO status , Patient's Chart, lab work & pertinent test results ? ?Airway ?Mallampati: II ? ?TM Distance: >3 FB ?Neck ROM: Full ? ? ? Dental ?no notable dental hx. ?(+) Teeth Intact, Dental Advisory Given ?  ?Pulmonary ?neg pulmonary ROS, former smoker,  ?  ?Pulmonary exam normal ?breath sounds clear to auscultation ? ? ? ? ? ? Cardiovascular ?Exercise Tolerance: Good ?Normal cardiovascular exam ?Rhythm:Regular Rate:Normal ? ? ?  ?Neuro/Psych ?negative psych ROS  ? GI/Hepatic ?Neg liver ROS, GERD  ,  ?Endo/Other  ? ? Renal/GU ?Renal diseaseHx of renal calculi  ? ?Prostate CA ? ?  ?Musculoskeletal ?negative musculoskeletal ROS ?(+)  ? Abdominal ?  ?Peds ? Hematology ?  ?Anesthesia Other Findings ? ? Reproductive/Obstetrics ? ?  ? ? ? ? ? ? ? ? ? ? ? ? ? ?  ?  ? ? ? ? ? ? ? ?Anesthesia Physical ?Anesthesia Plan ? ?ASA: 2 ? ?Anesthesia Plan: MAC  ? ?Post-op Pain Management:   ? ?Induction:  ? ?PONV Risk Score and Plan: Treatment may vary due to age or medical condition, Midazolam and Ondansetron ? ?Airway Management Planned: Natural Airway and Nasal Cannula ? ?Additional Equipment: None ? ?Intra-op Plan:  ? ?Post-operative Plan:  ? ?Informed Consent: I have reviewed the patients History and Physical, chart, labs and discussed the procedure including the risks, benefits and alternatives for the proposed anesthesia with the patient or authorized representative who has indicated his/her understanding and acceptance.  ? ? ? ?Dental advisory given ? ?Plan Discussed with: Anesthesiologist and CRNA ? ?Anesthesia Plan Comments:   ? ? ? ? ? ?Anesthesia Quick Evaluation ? ?

## 2021-07-11 ENCOUNTER — Ambulatory Visit (HOSPITAL_BASED_OUTPATIENT_CLINIC_OR_DEPARTMENT_OTHER): Payer: Federal, State, Local not specified - PPO | Admitting: Anesthesiology

## 2021-07-11 ENCOUNTER — Ambulatory Visit (HOSPITAL_BASED_OUTPATIENT_CLINIC_OR_DEPARTMENT_OTHER)
Admission: RE | Admit: 2021-07-11 | Discharge: 2021-07-11 | Disposition: A | Discharge status: Home / Self Care | Payer: Federal, State, Local not specified - PPO | Source: Ambulatory Visit | Attending: Urology | Admitting: Urology

## 2021-07-11 ENCOUNTER — Encounter (HOSPITAL_BASED_OUTPATIENT_CLINIC_OR_DEPARTMENT_OTHER): Payer: Self-pay | Admitting: Urology

## 2021-07-11 ENCOUNTER — Encounter (HOSPITAL_BASED_OUTPATIENT_CLINIC_OR_DEPARTMENT_OTHER)
Admission: RE | Disposition: A | Discharge status: Home / Self Care | Payer: Self-pay | Source: Ambulatory Visit | Attending: Urology

## 2021-07-11 DIAGNOSIS — Z87891 Personal history of nicotine dependence: Secondary | ICD-10-CM | POA: Insufficient documentation

## 2021-07-11 DIAGNOSIS — Z8616 Personal history of COVID-19: Secondary | ICD-10-CM | POA: Insufficient documentation

## 2021-07-11 DIAGNOSIS — C61 Malignant neoplasm of prostate: Secondary | ICD-10-CM | POA: Diagnosis present

## 2021-07-11 HISTORY — DX: Malignant neoplasm of prostate: C61

## 2021-07-11 HISTORY — DX: Prediabetes: R73.03

## 2021-07-11 HISTORY — PX: SPACE OAR INSTILLATION: SHX6769

## 2021-07-11 HISTORY — PX: GOLD SEED IMPLANT: SHX6343

## 2021-07-11 HISTORY — DX: Presence of spectacles and contact lenses: Z97.3

## 2021-07-11 LAB — POCT I-STAT, CHEM 8
BUN: 33 mg/dL — ABNORMAL HIGH (ref 8–23)
Calcium, Ion: 1.26 mmol/L (ref 1.15–1.40)
Chloride: 102 mmol/L (ref 98–111)
Creatinine, Ser: 1.3 mg/dL — ABNORMAL HIGH (ref 0.61–1.24)
Glucose, Bld: 107 mg/dL — ABNORMAL HIGH (ref 70–99)
HCT: 46 % (ref 39.0–52.0)
Hemoglobin: 15.6 g/dL (ref 13.0–17.0)
Potassium: 5.2 mmol/L — ABNORMAL HIGH (ref 3.5–5.1)
Sodium: 140 mmol/L (ref 135–145)
TCO2: 31 mmol/L (ref 22–32)

## 2021-07-11 SURGERY — INSERTION, GOLD SEEDS
Anesthesia: Monitor Anesthesia Care | Site: Prostate

## 2021-07-11 MED ORDER — LACTATED RINGERS IV SOLN
INTRAVENOUS | Status: DC
Start: 2021-07-11 — End: 2021-07-11

## 2021-07-11 MED ORDER — PROPOFOL 500 MG/50ML IV EMUL
INTRAVENOUS | Status: AC
Start: 2021-07-11 — End: ?
  Filled 2021-07-11: qty 50

## 2021-07-11 MED ORDER — PROPOFOL 500 MG/50ML IV EMUL
INTRAVENOUS | Status: DC | PRN
  Administered 2021-07-11: 150 ug/kg/min via INTRAVENOUS

## 2021-07-11 MED ORDER — FENTANYL CITRATE (PF) 100 MCG/2ML IJ SOLN: 25.0000 ug | INTRAMUSCULAR | Status: DC | PRN

## 2021-07-11 MED ORDER — LIDOCAINE HCL (PF) 2 % IJ SOLN
INTRAMUSCULAR | Status: AC
  Filled 2021-07-11: qty 5

## 2021-07-11 MED ORDER — CEFAZOLIN SODIUM-DEXTROSE 2-4 GM/100ML-% IV SOLN
INTRAVENOUS | Status: AC
  Filled 2021-07-11: qty 100

## 2021-07-11 MED ORDER — PROPOFOL 10 MG/ML IV BOLUS
INTRAVENOUS | Status: DC | PRN
  Administered 2021-07-11 (×3): 20 mg via INTRAVENOUS

## 2021-07-11 MED ORDER — ACETAMINOPHEN 10 MG/ML IV SOLN: 1000.0000 mg | Freq: Once | INTRAVENOUS | Status: DC | PRN

## 2021-07-11 MED ORDER — MIDAZOLAM HCL 2 MG/2ML IJ SOLN
INTRAMUSCULAR | Status: AC
  Filled 2021-07-11: qty 2

## 2021-07-11 MED ORDER — LIDOCAINE 2% (20 MG/ML) 5 ML SYRINGE
INTRAMUSCULAR | Status: DC | PRN
  Administered 2021-07-11: 100 mg via INTRAVENOUS

## 2021-07-11 MED ORDER — SODIUM CHLORIDE (PF) 0.9 % IJ SOLN
INTRAMUSCULAR | Status: DC | PRN
  Administered 2021-07-11: 10 mL via INTRAVENOUS

## 2021-07-11 MED ORDER — FLEET ENEMA 7-19 GM/118ML RE ENEM: 1.0000 | ENEMA | Freq: Once | RECTAL | Status: DC

## 2021-07-11 MED ORDER — LIDOCAINE HCL 2 % IJ SOLN
INTRAMUSCULAR | Status: DC | PRN
  Administered 2021-07-11: 10 mL

## 2021-07-11 MED ORDER — MIDAZOLAM HCL 5 MG/5ML IJ SOLN
INTRAMUSCULAR | Status: DC | PRN
  Administered 2021-07-11 (×2): 1 mg via INTRAVENOUS

## 2021-07-11 MED ORDER — CEFAZOLIN SODIUM-DEXTROSE 2-4 GM/100ML-% IV SOLN: 2.0000 g | Freq: Once | INTRAVENOUS | Status: DC

## 2021-07-11 MED ORDER — ONDANSETRON HCL 4 MG/2ML IJ SOLN: 4.0000 mg | Freq: Once | INTRAMUSCULAR | Status: DC | PRN

## 2021-07-11 SURGICAL SUPPLY — 24 items
BLADE CLIPPER SENSICLIP SURGIC (BLADE) ×2 IMPLANT
CNTNR URN SCR LID CUP LEK RST (MISCELLANEOUS) ×1 IMPLANT
CONT SPEC 4OZ STRL OR WHT (MISCELLANEOUS) ×2
COVER BACK TABLE 60X90IN (DRAPES) ×2 IMPLANT
DRSG IV TEGADERM 3.5X4.5 STRL (GAUZE/BANDAGES/DRESSINGS) ×1 IMPLANT
DRSG TEGADERM 4X4.75 (GAUZE/BANDAGES/DRESSINGS) ×2 IMPLANT
DRSG TEGADERM 8X12 (GAUZE/BANDAGES/DRESSINGS) ×2 IMPLANT
GAUZE SPONGE 4X4 12PLY STRL (GAUZE/BANDAGES/DRESSINGS) ×2 IMPLANT
GLOVE SURG ENC MOIS LTX SZ8 (GLOVE) ×2 IMPLANT
IMPL SPACEOAR VUE SYSTEM (Spacer) ×1 IMPLANT
IMPLANT SPACEOAR VUE SYSTEM (Spacer) ×2 IMPLANT
KIT TURNOVER CYSTO (KITS) ×2 IMPLANT
MARKER GOLD PRELOAD 1.2X3 (Urological Implant) ×3 IMPLANT
MARKER SKIN DUAL TIP RULER LAB (MISCELLANEOUS) ×2 IMPLANT
NDL SPNL 22GX7 QUINCKE BK (NEEDLE) ×1 IMPLANT
NEEDLE SPNL 22GX7 QUINCKE BK (NEEDLE) ×2 IMPLANT
SEED GOLD PRELOAD 1.2X3 (Urological Implant) ×2 IMPLANT
SHEATH ULTRASOUND LF (SHEATH) IMPLANT
SHEATH ULTRASOUND LTX NONSTRL (SHEATH) IMPLANT
SURGILUBE 2OZ TUBE FLIPTOP (MISCELLANEOUS) ×2 IMPLANT
SYR 10ML LL (SYRINGE) IMPLANT
SYR CONTROL 10ML LL (SYRINGE) ×2 IMPLANT
TOWEL OR 17X26 10 PK STRL BLUE (TOWEL DISPOSABLE) ×2 IMPLANT
UNDERPAD 30X36 HEAVY ABSORB (UNDERPADS AND DIAPERS) ×2 IMPLANT

## 2021-07-11 NOTE — Interval H&P Note (Signed)
History and Physical Interval Note: ? ?07/11/2021 ?8:41 AM ? ?Johnny Howard  has presented today for surgery, with the diagnosis of PROSTATE CANCER.  The various methods of treatment have been discussed with the patient and family. After consideration of risks, benefits and other options for treatment, the patient has consented to  Procedure(s): ?GOLD SEED IMPLANT (N/A) ?SPACE OAR INSTILLATION (N/A) as a surgical intervention.  The patient's history has been reviewed, patient examined, no change in status, stable for surgery.  I have reviewed the patient's chart and labs.  Questions were answered to the patient's satisfaction.   ? ? ?Johnny Howard Johnny Howard ? ? ?

## 2021-07-11 NOTE — Anesthesia Postprocedure Evaluation (Signed)
Anesthesia Post Note ? ?Patient: Johnny Howard ? ?Procedure(s) Performed: GOLD SEED IMPLANT (Prostate) ?SPACE OAR INSTILLATION (Prostate) ? ?  ? ?Patient location during evaluation: PACU ?Anesthesia Type: MAC ?Level of consciousness: awake and alert ?Pain management: pain level controlled ?Vital Signs Assessment: post-procedure vital signs reviewed and stable ?Respiratory status: spontaneous breathing, nonlabored ventilation, respiratory function stable and patient connected to nasal cannula oxygen ?Cardiovascular status: stable and blood pressure returned to baseline ?Postop Assessment: no apparent nausea or vomiting ?Anesthetic complications: no ? ? ?No notable events documented. ? ?Last Vitals:  ?Vitals:  ? 07/11/21 1045 07/11/21 1104  ?BP: 127/75   ?Pulse: 84 72  ?Resp: 13 (!) 0  ?Temp:    ?SpO2: 100% 100%  ?  ?Last Pain:  ?Vitals:  ? 07/11/21 1115  ?TempSrc:   ?PainSc: 0-No pain  ? ? ?  ?  ?  ?  ?  ?  ? ?Barnet Glasgow ? ? ? ? ?

## 2021-07-11 NOTE — Discharge Instructions (Addendum)
Take it easy today-no heavy activities ? ?Starting tomorrow, you may resume usual activities ? ?Report any fever or difficulty urinating to Dr. Abner Greenspan ?Post Anesthesia Home Care Instructions ? ?Activity: ?Get plenty of rest for the remainder of the day. A responsible adult should stay with you for 24 hours following the procedure.  ?For the next 24 hours, DO NOT: ?-Drive a car ?-Paediatric nurse ?-Drink alcoholic beverages ?-Take any medication unless instructed by your physician ?-Make any legal decisions or sign important papers. ? ?Meals: ?Start with liquid foods such as gelatin or soup. Progress to regular foods as tolerated. Avoid greasy, spicy, heavy foods. If nausea and/or vomiting occur, drink only clear liquids until the nausea and/or vomiting subsides. Call your physician if vomiting continues. ? ?Special Instructions/Symptoms: ?Your throat may feel dry or sore from the anesthesia or the breathing tube placed in your throat during surgery. If this causes discomfort, gargle with warm salt water. The discomfort should disappear within 24 hours. ? ? ?. ? ?   ?

## 2021-07-11 NOTE — Op Note (Signed)
Preoperative diagnosis: Adenocarcinoma prostate ? ?Postoperative diagnosis: Same ? ?Procedure: Placement of gold fiducial markers in the prostate, placement of SpaceOAR ? ?Surgeon: Diona Fanti ? ?Anesthesia: MAC ? ?Complications: None ? ?Estimated blood loss: Less than 5 mL ? ?Indications: 76 year old male with intermediate risk prostate cancer.  He presents at this time for placement of fiducial markers and SpaceOAR in advance of EBRT.  The procedure as well as expected outcomes/risks and complications have been discussed with him.  He understands and desires to proceed. ? ?Description of procedure: Patient properly identified in the holding area, administered IV antibiotics and taken to the operating room where MAC was administered.  He was then placed in the dorsolithotomy position.  Perineum was prepped with Betadine.  Proper timeout performed. ? ?Transrectal ultrasound probe was placed, and the prostate was imaged both in transverse and sagittal planes.  His perineal skin and the apex of the prostate bilaterally were infiltrated with lidocaine.  Following this, 3 fiducial markers were placed.  2 markers were then placed in the right prostate using ultrasound guidance-1 at the base, 1 at the apex.  The third fiducial marker was placed in the left mid prostate. ? ?Following this, 18-gauge spinal needle was placed within the fatty plane between the prostate and rectum.  This was placed in the mid medial prostate.  Once adequate positioning was seen using ultrasound guidance, approximately 5 cc of saline was utilized to verify position and flow of the saline within this proper plane.  Following this, the syringe was aspirated and no blood was seen.  At this point, the SpaceOAR gel was infiltrated into the space over the time period of approximately 12 seconds.  Adequate separation of the prostate and rectum was then seen.  The needle was then removed, the patient awakened and then taken to the PACU in stable  condition.  He tolerated the procedure well. ? ? ?

## 2021-07-11 NOTE — Transfer of Care (Signed)
Immediate Anesthesia Transfer of Care Note ? ?Patient: Johnny Howard ? ?Procedure(s) Performed: GOLD SEED IMPLANT (Prostate) ?SPACE OAR INSTILLATION (Prostate) ? ?Patient Location: PACU ? ?Anesthesia Type:MAC ? ?Level of Consciousness: drowsy ? ?Airway & Oxygen Therapy: Patient Spontanous Breathing ? ?Post-op Assessment: Post -op Vital signs reviewed and stable ? ?Post vital signs: Reviewed and stable ? ?Last Vitals:  ?Vitals Value Taken Time  ?BP 120/64 07/11/21 1015  ?Temp    ?Pulse 81 07/11/21 1018  ?Resp 15 07/11/21 1018  ?SpO2 99 % 07/11/21 1018  ?Vitals shown include unvalidated device data. ? ?Last Pain:  ?Vitals:  ? 07/11/21 0814  ?TempSrc: Oral  ?PainSc: 0-No pain  ?   ? ?Patients Stated Pain Goal: 5 (07/11/21 4818) ? ?Complications: No notable events documented. ?

## 2021-07-12 ENCOUNTER — Encounter (HOSPITAL_BASED_OUTPATIENT_CLINIC_OR_DEPARTMENT_OTHER): Payer: Self-pay | Admitting: Urology

## 2021-07-12 ENCOUNTER — Telehealth: Payer: Self-pay | Admitting: *Deleted

## 2021-07-12 NOTE — Telephone Encounter (Signed)
CALLED PATIENT TO REMIND OF SIM APPT. FOR 07-15-21- ARRIVAL TIME- 10:45 AM @ [facility], PATIENT TO ARRIVE WITH A FULL BLADDER AND AN EMPTY BOWEL, SPOKE WITH PATIENT AND HE IS AWARE OF THIS APPT. AND THE INSTRUCTIONS ?

## 2021-07-15 ENCOUNTER — Ambulatory Visit: Payer: Federal, State, Local not specified - PPO | Admitting: Radiation Oncology

## 2021-07-17 ENCOUNTER — Telehealth: Payer: Self-pay | Admitting: *Deleted

## 2021-07-17 NOTE — Telephone Encounter (Signed)
CALLED PATIENT TO REMIND OF SIM APPT. FOR 07-18-21- ARRIVAL TIME- 9:45 AM @ [facility], PATIENT INFORMED TO ARRIVE WITH A FULL BLADDER AND AN EMPTY BOWEL, SPOKE WITH PATIENT AND HE IS AWARE OF THIS APPT. ?

## 2021-07-18 ENCOUNTER — Other Ambulatory Visit: Payer: Self-pay

## 2021-07-18 ENCOUNTER — Ambulatory Visit
Admission: RE | Admit: 2021-07-18 | Discharge: 2021-07-18 | Disposition: A | Discharge status: Home / Self Care | Payer: Federal, State, Local not specified - PPO | Source: Ambulatory Visit | Attending: Radiation Oncology | Admitting: Radiation Oncology

## 2021-07-18 DIAGNOSIS — C61 Malignant neoplasm of prostate: Secondary | ICD-10-CM | POA: Insufficient documentation

## 2021-07-18 NOTE — Progress Notes (Signed)
?  Radiation Oncology         [phone] ?________________________________ ? ?Name: Johnny Howard MRN: [id number]  ?Date: 07/18/2021  DOB: April 07, 1946 ? ?SIMULATION AND TREATMENT PLANNING NOTE ? ?  ICD-10-CM   ?1. Malignant neoplasm of prostate ([place])  C61   ?  ? ? ?DIAGNOSIS:  76 y.o. gentleman with Stage T1c adenocarcinoma of the prostate with Gleason score of 4+3, and PSA of 5.57. ? ?NARRATIVE:  The patient was brought to the [place] [facility].  Identity was confirmed.  All relevant records and images related to the planned course of therapy were reviewed.  The patient freely provided informed written consent to proceed with treatment after reviewing the details related to the planned course of therapy. The consent form was witnessed and verified by the simulation staff.  Then, the patient was set-up in a stable reproducible supine position for radiation therapy.  A vacuum lock pillow device was custom fabricated to position his legs in a reproducible immobilized position.  Then, I performed a urethrogram under sterile conditions to identify the prostatic apex.  CT images were obtained.  Surface markings were placed.  The CT images were loaded into the planning software.  Then the prostate target and avoidance structures including the rectum, bladder, bowel and hips were contoured.  Treatment planning then occurred.  The radiation prescription was entered and confirmed.  A total of one complex treatment devices was fabricated. I have requested : Intensity Modulated Radiotherapy (IMRT) is medically necessary for this case for the following reason:  Rectal sparing.. ? ?PLAN:  The patient will receive 70 Gy in 28 fractions. ? ?________________________________ ? ?Sheral Apley Tammi Klippel, M.D. ? ?

## 2021-07-25 DIAGNOSIS — C61 Malignant neoplasm of prostate: Secondary | ICD-10-CM | POA: Insufficient documentation

## 2021-07-29 ENCOUNTER — Ambulatory Visit
Admission: RE | Admit: 2021-07-29 | Discharge: 2021-07-29 | Disposition: A | Discharge status: Home / Self Care | Payer: Federal, State, Local not specified - PPO | Source: Ambulatory Visit | Attending: Radiation Oncology | Admitting: Radiation Oncology

## 2021-07-29 ENCOUNTER — Other Ambulatory Visit: Payer: Self-pay

## 2021-07-29 DIAGNOSIS — C61 Malignant neoplasm of prostate: Secondary | ICD-10-CM | POA: Diagnosis not present

## 2021-07-30 ENCOUNTER — Ambulatory Visit
Admission: RE | Admit: 2021-07-30 | Discharge: 2021-07-30 | Disposition: A | Discharge status: Home / Self Care | Payer: Federal, State, Local not specified - PPO | Source: Ambulatory Visit | Attending: Radiation Oncology | Admitting: Radiation Oncology

## 2021-07-30 DIAGNOSIS — C61 Malignant neoplasm of prostate: Secondary | ICD-10-CM | POA: Diagnosis not present

## 2021-07-31 ENCOUNTER — Ambulatory Visit
Admission: RE | Admit: 2021-07-31 | Discharge: 2021-07-31 | Disposition: A | Discharge status: Home / Self Care | Payer: Federal, State, Local not specified - PPO | Source: Ambulatory Visit | Attending: Radiation Oncology | Admitting: Radiation Oncology

## 2021-07-31 DIAGNOSIS — C61 Malignant neoplasm of prostate: Secondary | ICD-10-CM | POA: Diagnosis not present

## 2021-08-01 ENCOUNTER — Other Ambulatory Visit: Payer: Self-pay

## 2021-08-01 ENCOUNTER — Ambulatory Visit
Admission: RE | Admit: 2021-08-01 | Discharge: 2021-08-01 | Disposition: A | Discharge status: Home / Self Care | Payer: Federal, State, Local not specified - PPO | Source: Ambulatory Visit | Attending: Radiation Oncology | Admitting: Radiation Oncology

## 2021-08-01 DIAGNOSIS — C61 Malignant neoplasm of prostate: Secondary | ICD-10-CM | POA: Diagnosis not present

## 2021-08-02 ENCOUNTER — Ambulatory Visit
Admission: RE | Admit: 2021-08-02 | Discharge: 2021-08-02 | Disposition: A | Discharge status: Home / Self Care | Payer: Federal, State, Local not specified - PPO | Source: Ambulatory Visit | Attending: Radiation Oncology | Admitting: Radiation Oncology

## 2021-08-02 DIAGNOSIS — C61 Malignant neoplasm of prostate: Secondary | ICD-10-CM | POA: Diagnosis not present

## 2021-08-05 ENCOUNTER — Other Ambulatory Visit: Payer: Self-pay

## 2021-08-05 ENCOUNTER — Ambulatory Visit
Admission: RE | Admit: 2021-08-05 | Discharge: 2021-08-05 | Disposition: A | Discharge status: Home / Self Care | Payer: Federal, State, Local not specified - PPO | Source: Ambulatory Visit | Attending: Radiation Oncology | Admitting: Radiation Oncology

## 2021-08-05 DIAGNOSIS — C61 Malignant neoplasm of prostate: Secondary | ICD-10-CM | POA: Diagnosis not present

## 2021-08-06 ENCOUNTER — Other Ambulatory Visit: Payer: Self-pay

## 2021-08-06 ENCOUNTER — Ambulatory Visit
Admission: RE | Admit: 2021-08-06 | Discharge: 2021-08-06 | Disposition: A | Discharge status: Home / Self Care | Payer: Federal, State, Local not specified - PPO | Source: Ambulatory Visit | Attending: Radiation Oncology | Admitting: Radiation Oncology

## 2021-08-06 DIAGNOSIS — C61 Malignant neoplasm of prostate: Secondary | ICD-10-CM | POA: Diagnosis not present

## 2021-08-06 LAB — RAD ONC ARIA SESSION SUMMARY
Course Elapsed Days: 8
Plan Fractions Treated to Date: 7
Plan Prescribed Dose Per Fraction: 2.5 Gy
Plan Total Fractions Prescribed: 28
Plan Total Prescribed Dose: 70 Gy
Reference Point Dosage Given to Date: 17.5 Gy
Reference Point Session Dosage Given: 2.5 Gy
Session Number: 7

## 2021-08-07 ENCOUNTER — Other Ambulatory Visit: Payer: Self-pay

## 2021-08-07 ENCOUNTER — Ambulatory Visit
Admission: RE | Admit: 2021-08-07 | Discharge: 2021-08-07 | Disposition: A | Discharge status: Home / Self Care | Payer: Federal, State, Local not specified - PPO | Source: Ambulatory Visit | Attending: Radiation Oncology | Admitting: Radiation Oncology

## 2021-08-07 DIAGNOSIS — C61 Malignant neoplasm of prostate: Secondary | ICD-10-CM | POA: Diagnosis not present

## 2021-08-07 LAB — RAD ONC ARIA SESSION SUMMARY
Course Elapsed Days: 9
Plan Fractions Treated to Date: 8
Plan Prescribed Dose Per Fraction: 2.5 Gy
Plan Total Fractions Prescribed: 28
Plan Total Prescribed Dose: 70 Gy
Reference Point Dosage Given to Date: 20 Gy
Reference Point Session Dosage Given: 2.5 Gy
Session Number: 8

## 2021-08-08 ENCOUNTER — Ambulatory Visit
Admission: RE | Admit: 2021-08-08 | Discharge: 2021-08-08 | Disposition: A | Discharge status: Home / Self Care | Payer: Federal, State, Local not specified - PPO | Source: Ambulatory Visit | Attending: Radiation Oncology | Admitting: Radiation Oncology

## 2021-08-08 ENCOUNTER — Other Ambulatory Visit: Payer: Self-pay

## 2021-08-08 DIAGNOSIS — C61 Malignant neoplasm of prostate: Secondary | ICD-10-CM | POA: Diagnosis not present

## 2021-08-08 LAB — RAD ONC ARIA SESSION SUMMARY
Course Elapsed Days: 10
Plan Fractions Treated to Date: 9
Plan Prescribed Dose Per Fraction: 2.5 Gy
Plan Total Fractions Prescribed: 28
Plan Total Prescribed Dose: 70 Gy
Reference Point Dosage Given to Date: 22.5 Gy
Reference Point Session Dosage Given: 2.5 Gy
Session Number: 9

## 2021-08-09 ENCOUNTER — Other Ambulatory Visit: Payer: Self-pay

## 2021-08-09 ENCOUNTER — Ambulatory Visit
Admission: RE | Admit: 2021-08-09 | Discharge: 2021-08-09 | Disposition: A | Discharge status: Home / Self Care | Payer: Federal, State, Local not specified - PPO | Source: Ambulatory Visit | Attending: Radiation Oncology | Admitting: Radiation Oncology

## 2021-08-09 DIAGNOSIS — C61 Malignant neoplasm of prostate: Secondary | ICD-10-CM | POA: Diagnosis not present

## 2021-08-09 LAB — RAD ONC ARIA SESSION SUMMARY
Course Elapsed Days: 11
Plan Fractions Treated to Date: 10
Plan Prescribed Dose Per Fraction: 2.5 Gy
Plan Total Fractions Prescribed: 28
Plan Total Prescribed Dose: 70 Gy
Reference Point Dosage Given to Date: 25 Gy
Reference Point Session Dosage Given: 2.5 Gy
Session Number: 10

## 2021-08-12 ENCOUNTER — Other Ambulatory Visit: Payer: Self-pay

## 2021-08-12 ENCOUNTER — Ambulatory Visit
Admission: RE | Admit: 2021-08-12 | Discharge: 2021-08-12 | Disposition: A | Discharge status: Home / Self Care | Payer: Federal, State, Local not specified - PPO | Source: Ambulatory Visit | Attending: Radiation Oncology | Admitting: Radiation Oncology

## 2021-08-12 DIAGNOSIS — C61 Malignant neoplasm of prostate: Secondary | ICD-10-CM | POA: Diagnosis not present

## 2021-08-12 LAB — RAD ONC ARIA SESSION SUMMARY
Course Elapsed Days: 14
Plan Fractions Treated to Date: 11
Plan Prescribed Dose Per Fraction: 2.5 Gy
Plan Total Fractions Prescribed: 28
Plan Total Prescribed Dose: 70 Gy
Reference Point Dosage Given to Date: 27.5 Gy
Reference Point Session Dosage Given: 2.5 Gy
Session Number: 11

## 2021-08-13 ENCOUNTER — Ambulatory Visit
Admission: RE | Admit: 2021-08-13 | Discharge: 2021-08-13 | Disposition: A | Discharge status: Home / Self Care | Payer: Federal, State, Local not specified - PPO | Source: Ambulatory Visit | Attending: Radiation Oncology | Admitting: Radiation Oncology

## 2021-08-13 ENCOUNTER — Other Ambulatory Visit: Payer: Self-pay

## 2021-08-13 DIAGNOSIS — C61 Malignant neoplasm of prostate: Secondary | ICD-10-CM | POA: Diagnosis not present

## 2021-08-13 LAB — RAD ONC ARIA SESSION SUMMARY
Course Elapsed Days: 15
Plan Fractions Treated to Date: 12
Plan Prescribed Dose Per Fraction: 2.5 Gy
Plan Total Fractions Prescribed: 28
Plan Total Prescribed Dose: 70 Gy
Reference Point Dosage Given to Date: 30 Gy
Reference Point Session Dosage Given: 2.5 Gy
Session Number: 12

## 2021-08-14 ENCOUNTER — Ambulatory Visit
Admission: RE | Admit: 2021-08-14 | Discharge: 2021-08-14 | Disposition: A | Discharge status: Home / Self Care | Payer: Federal, State, Local not specified - PPO | Source: Ambulatory Visit | Attending: Radiation Oncology | Admitting: Radiation Oncology

## 2021-08-14 ENCOUNTER — Other Ambulatory Visit: Payer: Self-pay

## 2021-08-14 DIAGNOSIS — C61 Malignant neoplasm of prostate: Secondary | ICD-10-CM | POA: Diagnosis not present

## 2021-08-14 LAB — RAD ONC ARIA SESSION SUMMARY
Course Elapsed Days: 16
Plan Fractions Treated to Date: 13
Plan Prescribed Dose Per Fraction: 2.5 Gy
Plan Total Fractions Prescribed: 28
Plan Total Prescribed Dose: 70 Gy
Reference Point Dosage Given to Date: 32.5 Gy
Reference Point Session Dosage Given: 2.5 Gy
Session Number: 13

## 2021-08-15 ENCOUNTER — Ambulatory Visit
Admission: RE | Admit: 2021-08-15 | Discharge: 2021-08-15 | Disposition: A | Discharge status: Home / Self Care | Payer: Federal, State, Local not specified - PPO | Source: Ambulatory Visit | Attending: Radiation Oncology | Admitting: Radiation Oncology

## 2021-08-15 ENCOUNTER — Other Ambulatory Visit: Payer: Self-pay

## 2021-08-15 DIAGNOSIS — C61 Malignant neoplasm of prostate: Secondary | ICD-10-CM | POA: Diagnosis not present

## 2021-08-15 LAB — RAD ONC ARIA SESSION SUMMARY
Course Elapsed Days: 17
Plan Fractions Treated to Date: 14
Plan Prescribed Dose Per Fraction: 2.5 Gy
Plan Total Fractions Prescribed: 28
Plan Total Prescribed Dose: 70 Gy
Reference Point Dosage Given to Date: 35 Gy
Reference Point Session Dosage Given: 2.5 Gy
Session Number: 14

## 2021-08-16 ENCOUNTER — Other Ambulatory Visit: Payer: Self-pay

## 2021-08-16 ENCOUNTER — Ambulatory Visit
Admission: RE | Admit: 2021-08-16 | Discharge: 2021-08-16 | Disposition: A | Discharge status: Home / Self Care | Payer: Federal, State, Local not specified - PPO | Source: Ambulatory Visit | Attending: Radiation Oncology | Admitting: Radiation Oncology

## 2021-08-16 DIAGNOSIS — C61 Malignant neoplasm of prostate: Secondary | ICD-10-CM | POA: Diagnosis not present

## 2021-08-16 LAB — RAD ONC ARIA SESSION SUMMARY
Course Elapsed Days: 18
Plan Fractions Treated to Date: 15
Plan Prescribed Dose Per Fraction: 2.5 Gy
Plan Total Fractions Prescribed: 28
Plan Total Prescribed Dose: 70 Gy
Reference Point Dosage Given to Date: 37.5 Gy
Reference Point Session Dosage Given: 2.5 Gy
Session Number: 15

## 2021-08-19 ENCOUNTER — Other Ambulatory Visit: Payer: Self-pay

## 2021-08-19 ENCOUNTER — Ambulatory Visit
Admission: RE | Admit: 2021-08-19 | Discharge: 2021-08-19 | Disposition: A | Discharge status: Home / Self Care | Payer: Federal, State, Local not specified - PPO | Source: Ambulatory Visit | Attending: Radiation Oncology | Admitting: Radiation Oncology

## 2021-08-19 DIAGNOSIS — C61 Malignant neoplasm of prostate: Secondary | ICD-10-CM | POA: Insufficient documentation

## 2021-08-19 LAB — RAD ONC ARIA SESSION SUMMARY
Course Elapsed Days: 21
Plan Fractions Treated to Date: 16
Plan Prescribed Dose Per Fraction: 2.5 Gy
Plan Total Fractions Prescribed: 28
Plan Total Prescribed Dose: 70 Gy
Reference Point Dosage Given to Date: 40 Gy
Reference Point Session Dosage Given: 2.5 Gy
Session Number: 16

## 2021-08-20 ENCOUNTER — Other Ambulatory Visit: Payer: Self-pay

## 2021-08-20 ENCOUNTER — Ambulatory Visit
Admission: RE | Admit: 2021-08-20 | Discharge: 2021-08-20 | Disposition: A | Discharge status: Home / Self Care | Payer: Federal, State, Local not specified - PPO | Source: Ambulatory Visit | Attending: Radiation Oncology | Admitting: Radiation Oncology

## 2021-08-20 DIAGNOSIS — C61 Malignant neoplasm of prostate: Secondary | ICD-10-CM | POA: Diagnosis not present

## 2021-08-20 LAB — RAD ONC ARIA SESSION SUMMARY
Course Elapsed Days: 22
Plan Fractions Treated to Date: 17
Plan Prescribed Dose Per Fraction: 2.5 Gy
Plan Total Fractions Prescribed: 28
Plan Total Prescribed Dose: 70 Gy
Reference Point Dosage Given to Date: 42.5 Gy
Reference Point Session Dosage Given: 2.5 Gy
Session Number: 17

## 2021-08-21 ENCOUNTER — Ambulatory Visit
Admission: RE | Admit: 2021-08-21 | Discharge: 2021-08-21 | Disposition: A | Discharge status: Home / Self Care | Payer: Federal, State, Local not specified - PPO | Source: Ambulatory Visit | Attending: Radiation Oncology | Admitting: Radiation Oncology

## 2021-08-21 ENCOUNTER — Other Ambulatory Visit: Payer: Self-pay

## 2021-08-21 DIAGNOSIS — C61 Malignant neoplasm of prostate: Secondary | ICD-10-CM | POA: Diagnosis not present

## 2021-08-21 LAB — RAD ONC ARIA SESSION SUMMARY
Course Elapsed Days: 23
Plan Fractions Treated to Date: 18
Plan Prescribed Dose Per Fraction: 2.5 Gy
Plan Total Fractions Prescribed: 28
Plan Total Prescribed Dose: 70 Gy
Reference Point Dosage Given to Date: 45 Gy
Reference Point Session Dosage Given: 2.5 Gy
Session Number: 18

## 2021-08-22 ENCOUNTER — Ambulatory Visit
Admission: RE | Admit: 2021-08-22 | Discharge: 2021-08-22 | Disposition: A | Discharge status: Home / Self Care | Payer: Federal, State, Local not specified - PPO | Source: Ambulatory Visit | Attending: Radiation Oncology | Admitting: Radiation Oncology

## 2021-08-22 ENCOUNTER — Other Ambulatory Visit: Payer: Self-pay

## 2021-08-22 DIAGNOSIS — C61 Malignant neoplasm of prostate: Secondary | ICD-10-CM | POA: Diagnosis not present

## 2021-08-22 LAB — RAD ONC ARIA SESSION SUMMARY
Course Elapsed Days: 24
Plan Fractions Treated to Date: 19
Plan Prescribed Dose Per Fraction: 2.5 Gy
Plan Total Fractions Prescribed: 28
Plan Total Prescribed Dose: 70 Gy
Reference Point Dosage Given to Date: 47.5 Gy
Reference Point Session Dosage Given: 2.5 Gy
Session Number: 19

## 2021-08-23 ENCOUNTER — Other Ambulatory Visit: Payer: Self-pay

## 2021-08-23 ENCOUNTER — Ambulatory Visit
Admission: RE | Admit: 2021-08-23 | Discharge: 2021-08-23 | Disposition: A | Discharge status: Home / Self Care | Payer: Federal, State, Local not specified - PPO | Source: Ambulatory Visit | Attending: Radiation Oncology | Admitting: Radiation Oncology

## 2021-08-23 DIAGNOSIS — C61 Malignant neoplasm of prostate: Secondary | ICD-10-CM | POA: Diagnosis not present

## 2021-08-23 LAB — RAD ONC ARIA SESSION SUMMARY
Course Elapsed Days: 25
Plan Fractions Treated to Date: 20
Plan Prescribed Dose Per Fraction: 2.5 Gy
Plan Total Fractions Prescribed: 28
Plan Total Prescribed Dose: 70 Gy
Reference Point Dosage Given to Date: 50 Gy
Reference Point Session Dosage Given: 2.5 Gy
Session Number: 20

## 2021-08-26 ENCOUNTER — Other Ambulatory Visit: Payer: Self-pay

## 2021-08-26 ENCOUNTER — Ambulatory Visit
Admission: RE | Admit: 2021-08-26 | Discharge: 2021-08-26 | Disposition: A | Discharge status: Home / Self Care | Payer: Federal, State, Local not specified - PPO | Source: Ambulatory Visit | Attending: Radiation Oncology | Admitting: Radiation Oncology

## 2021-08-26 DIAGNOSIS — C61 Malignant neoplasm of prostate: Secondary | ICD-10-CM | POA: Diagnosis not present

## 2021-08-26 LAB — RAD ONC ARIA SESSION SUMMARY
Course Elapsed Days: 28
Plan Fractions Treated to Date: 21
Plan Prescribed Dose Per Fraction: 2.5 Gy
Plan Total Fractions Prescribed: 28
Plan Total Prescribed Dose: 70 Gy
Reference Point Dosage Given to Date: 52.5 Gy
Reference Point Session Dosage Given: 2.5 Gy
Session Number: 21

## 2021-08-27 ENCOUNTER — Ambulatory Visit
Admission: RE | Admit: 2021-08-27 | Discharge: 2021-08-27 | Disposition: A | Discharge status: Home / Self Care | Payer: Federal, State, Local not specified - PPO | Source: Ambulatory Visit | Attending: Radiation Oncology | Admitting: Radiation Oncology

## 2021-08-27 ENCOUNTER — Other Ambulatory Visit: Payer: Self-pay

## 2021-08-27 DIAGNOSIS — C61 Malignant neoplasm of prostate: Secondary | ICD-10-CM | POA: Diagnosis not present

## 2021-08-27 LAB — RAD ONC ARIA SESSION SUMMARY
Course Elapsed Days: 29
Plan Fractions Treated to Date: 22
Plan Prescribed Dose Per Fraction: 2.5 Gy
Plan Total Fractions Prescribed: 28
Plan Total Prescribed Dose: 70 Gy
Reference Point Dosage Given to Date: 55 Gy
Reference Point Session Dosage Given: 2.5 Gy
Session Number: 22

## 2021-08-28 ENCOUNTER — Other Ambulatory Visit: Payer: Self-pay

## 2021-08-28 ENCOUNTER — Ambulatory Visit
Admission: RE | Admit: 2021-08-28 | Discharge: 2021-08-28 | Disposition: A | Discharge status: Home / Self Care | Payer: Federal, State, Local not specified - PPO | Source: Ambulatory Visit | Attending: Radiation Oncology | Admitting: Radiation Oncology

## 2021-08-28 DIAGNOSIS — C61 Malignant neoplasm of prostate: Secondary | ICD-10-CM | POA: Diagnosis not present

## 2021-08-28 LAB — RAD ONC ARIA SESSION SUMMARY
Course Elapsed Days: 30
Plan Fractions Treated to Date: 23
Plan Prescribed Dose Per Fraction: 2.5 Gy
Plan Total Fractions Prescribed: 28
Plan Total Prescribed Dose: 70 Gy
Reference Point Dosage Given to Date: 57.5 Gy
Reference Point Session Dosage Given: 2.5 Gy
Session Number: 23

## 2021-08-29 ENCOUNTER — Ambulatory Visit
Admission: RE | Admit: 2021-08-29 | Discharge: 2021-08-29 | Disposition: A | Discharge status: Home / Self Care | Payer: Federal, State, Local not specified - PPO | Source: Ambulatory Visit | Attending: Radiation Oncology | Admitting: Radiation Oncology

## 2021-08-29 ENCOUNTER — Other Ambulatory Visit: Payer: Self-pay

## 2021-08-29 DIAGNOSIS — C61 Malignant neoplasm of prostate: Secondary | ICD-10-CM | POA: Diagnosis not present

## 2021-08-29 LAB — RAD ONC ARIA SESSION SUMMARY
Course Elapsed Days: 31
Plan Fractions Treated to Date: 24
Plan Prescribed Dose Per Fraction: 2.5 Gy
Plan Total Fractions Prescribed: 28
Plan Total Prescribed Dose: 70 Gy
Reference Point Dosage Given to Date: 60 Gy
Reference Point Session Dosage Given: 2.5 Gy
Session Number: 24

## 2021-08-30 ENCOUNTER — Other Ambulatory Visit: Payer: Self-pay

## 2021-08-30 ENCOUNTER — Ambulatory Visit
Admission: RE | Admit: 2021-08-30 | Discharge: 2021-08-30 | Disposition: A | Discharge status: Home / Self Care | Payer: Federal, State, Local not specified - PPO | Source: Ambulatory Visit | Attending: Radiation Oncology | Admitting: Radiation Oncology

## 2021-08-30 DIAGNOSIS — C61 Malignant neoplasm of prostate: Secondary | ICD-10-CM | POA: Diagnosis not present

## 2021-08-30 LAB — RAD ONC ARIA SESSION SUMMARY
Course Elapsed Days: 32
Plan Fractions Treated to Date: 25
Plan Prescribed Dose Per Fraction: 2.5 Gy
Plan Total Fractions Prescribed: 28
Plan Total Prescribed Dose: 70 Gy
Reference Point Dosage Given to Date: 62.5 Gy
Reference Point Session Dosage Given: 2.5 Gy
Session Number: 25

## 2021-09-02 ENCOUNTER — Other Ambulatory Visit: Payer: Self-pay

## 2021-09-02 ENCOUNTER — Ambulatory Visit
Admission: RE | Admit: 2021-09-02 | Discharge: 2021-09-02 | Disposition: A | Discharge status: Home / Self Care | Payer: Federal, State, Local not specified - PPO | Source: Ambulatory Visit | Attending: Radiation Oncology | Admitting: Radiation Oncology

## 2021-09-02 DIAGNOSIS — C61 Malignant neoplasm of prostate: Secondary | ICD-10-CM | POA: Diagnosis not present

## 2021-09-02 LAB — RAD ONC ARIA SESSION SUMMARY
Course Elapsed Days: 35
Plan Fractions Treated to Date: 26
Plan Prescribed Dose Per Fraction: 2.5 Gy
Plan Total Fractions Prescribed: 28
Plan Total Prescribed Dose: 70 Gy
Reference Point Dosage Given to Date: 65 Gy
Reference Point Session Dosage Given: 2.5 Gy
Session Number: 26

## 2021-09-03 ENCOUNTER — Other Ambulatory Visit: Payer: Self-pay

## 2021-09-03 ENCOUNTER — Ambulatory Visit
Admission: RE | Admit: 2021-09-03 | Discharge: 2021-09-03 | Disposition: A | Discharge status: Home / Self Care | Payer: Federal, State, Local not specified - PPO | Source: Ambulatory Visit | Attending: Radiation Oncology | Admitting: Radiation Oncology

## 2021-09-03 DIAGNOSIS — C61 Malignant neoplasm of prostate: Secondary | ICD-10-CM | POA: Diagnosis not present

## 2021-09-03 LAB — RAD ONC ARIA SESSION SUMMARY
Course Elapsed Days: 36
Plan Fractions Treated to Date: 27
Plan Prescribed Dose Per Fraction: 2.5 Gy
Plan Total Fractions Prescribed: 28
Plan Total Prescribed Dose: 70 Gy
Reference Point Dosage Given to Date: 67.5 Gy
Reference Point Session Dosage Given: 2.5 Gy
Session Number: 27

## 2021-09-04 ENCOUNTER — Encounter: Payer: Self-pay | Admitting: Urology

## 2021-09-04 ENCOUNTER — Ambulatory Visit
Admission: RE | Admit: 2021-09-04 | Discharge: 2021-09-04 | Disposition: A | Discharge status: Home / Self Care | Payer: Federal, State, Local not specified - PPO | Source: Ambulatory Visit | Attending: Radiation Oncology | Admitting: Radiation Oncology

## 2021-09-04 ENCOUNTER — Other Ambulatory Visit: Payer: Self-pay

## 2021-09-04 DIAGNOSIS — C61 Malignant neoplasm of prostate: Secondary | ICD-10-CM

## 2021-09-04 LAB — RAD ONC ARIA SESSION SUMMARY
Course Elapsed Days: 37
Plan Fractions Treated to Date: 28
Plan Prescribed Dose Per Fraction: 2.5 Gy
Plan Total Fractions Prescribed: 28
Plan Total Prescribed Dose: 70 Gy
Reference Point Dosage Given to Date: 70 Gy
Reference Point Session Dosage Given: 2.5 Gy
Session Number: 28

## 2021-10-03 ENCOUNTER — Other Ambulatory Visit: Payer: Self-pay | Admitting: Internal Medicine

## 2021-10-03 DIAGNOSIS — R7989 Other specified abnormal findings of blood chemistry: Secondary | ICD-10-CM

## 2021-10-08 ENCOUNTER — Encounter: Payer: Self-pay | Admitting: Urology

## 2021-10-08 ENCOUNTER — Other Ambulatory Visit: Payer: Self-pay | Admitting: Gastroenterology

## 2021-10-08 DIAGNOSIS — K769 Liver disease, unspecified: Secondary | ICD-10-CM

## 2021-10-08 NOTE — Progress Notes (Signed)
Telephone appointment. I verified patient's identity and began nursing interview. No issues reported at this time.  Meaningful use complete. I-PSS score of 3-mild No urinary management medications. Urology appointment- July 26th, 2023 w/ [facility]  Reminded patient of his 10:00am-10/09/21 telephone appointment w/ Ashlyn Bruning PA-C. I left my extension [phone] in case patient needs anything. Patient verbalized understanding.  Patient contact [phone]

## 2021-10-09 ENCOUNTER — Ambulatory Visit
Admission: RE | Admit: 2021-10-09 | Discharge: 2021-10-09 | Disposition: A | Discharge status: Home / Self Care | Payer: Federal, State, Local not specified - PPO | Source: Ambulatory Visit | Attending: Urology | Admitting: Urology

## 2021-10-09 DIAGNOSIS — C61 Malignant neoplasm of prostate: Secondary | ICD-10-CM

## 2021-10-09 NOTE — Progress Notes (Signed)
Radiation Oncology         ([phone]) [phone] ________________________________  Name: Johnny Howard MRN: [id number]  Date: 10/09/2021  DOB: 08/13/1945  Post Treatment Note  CC: Jilda Panda, MD  Janith Lima, MD  Diagnosis:   76 y.o. gentleman with Stage T1c adenocarcinoma of the prostate with Gleason score of 4+3, and PSA of 5.57.  Interval Since Last Radiation:  5 weeks; concurrent with ST-ADT 07/29/21 - 09/04/21:  The prostate was treated to 70 Gy in 28 fractions of 2.5 Gy  Narrative:  I spoke with the patient to conduct his routine scheduled 1 month follow up visit via telephone to spare the patient unnecessary potential exposure in the healthcare setting during the current COVID-19 pandemic.  The patient was notified in advance and gave permission to proceed with this visit format.  He tolerated radiation treatment relatively well with only minor urinary irritation and modest fatigue.  He reported increased nocturia 3-5 times per night but denied any abdominal pain or bowel issues.                              On review of systems, the patient states he is doing very well in general.  He continues with nocturia 3-4 times per night and is still taking finasteride daily as prescribed.  He specifically denies dysuria, gross hematuria, straining to void, incomplete bladder emptying or incontinence.  He reports a healthy appetite and is maintaining his weight.  He denies abdominal pain, nausea, vomiting, diarrhea or constipation.  He continues to tolerate the ADT well despite occasional hot flashes but feels that these are gradually improving.  He has also noticed gradual improvement in his stamina and overall, is quite pleased with his progress to date.  ALLERGIES:  has No Known Allergies.  Meds: Current Outpatient Medications  Medication Sig Dispense Refill   finasteride (PROSCAR) 5 MG tablet Take 5 mg by mouth daily.     atorvastatin (LIPITOR) 40 MG tablet Take 40 mg by mouth daily.  5    psyllium (METAMUCIL) 58.6 % packet Take 1 packet by mouth daily. 3  heaping teaspooons daily     sildenafil (VIAGRA) 50 MG tablet '60MG'$  1 tablet as needed     No current facility-administered medications for this encounter.    Physical Findings:  vitals were not taken for this visit.  Pain Assessment Pain Score: 0-No pain/10 Unable to assess due to telephone follow-up visit format.  Lab Findings: Lab Results  Component Value Date   WBC 15.5 (H) 06/27/2015   HGB 15.6 07/11/2021   HCT 46.0 07/11/2021   MCV 92.0 06/27/2015   PLT 210 06/27/2015     Radiographic Findings: No results found.  Impression/Plan: 1. 76 y.o. gentleman with Stage T1c adenocarcinoma of the prostate with Gleason score of 4+3, and PSA of 5.57. He will continue to follow up with urology for ongoing PSA determinations and has an appointment scheduled with Dr. Abner Greenspan on 11/13/2021. He understands what to expect with regards to PSA monitoring going forward. I will look forward to following his response to treatment via correspondence with urology, and would be happy to continue to participate in his care if clinically indicated. I talked to the patient about what to expect in the future, including his risk for erectile dysfunction and rectal bleeding. I encouraged him to call or return to the office if he has any questions regarding his previous radiation or possible  radiation side effects. He was comfortable with this plan and will follow up as needed.     Nicholos Johns, PA-C

## 2021-10-09 NOTE — Progress Notes (Signed)
  Radiation Oncology         [phone] ________________________________  Name: Johnny Howard MRN: [id number]  Date: 09/04/2021  DOB: [birth year]  End of Treatment Note  Diagnosis:   76 y.o. gentleman with Stage T1c adenocarcinoma of the prostate with Gleason score of 4+3, and PSA of 5.57.     Indication for treatment:  Curative, Definitive Radiotherapy       Radiation treatment dates:   07/29/21 - 09/04/21  Site/dose:   The prostate was treated to 70 Gy in 28 fractions of 2.5 Gy  Beams/energy:   The patient was treated with IMRT using volumetric arc therapy delivering 6 MV X-rays to clockwise and counterclockwise circumferential arcs with a 90 degree collimator offset to avoid dose scalloping.  Image guidance was performed with daily cone beam CT prior to each fraction to align to gold markers in the prostate and assure proper bladder and rectal fill volumes.  Immobilization was achieved with BodyFix custom mold.  Narrative: The patient tolerated radiation treatment relatively well with only minor urinary irritation and modest fatigue.  He reported increased nocturia 3-5 times per night but denied any abdominal pain or bowel issues.  Plan: The patient has completed radiation treatment. He will return to radiation oncology clinic for routine followup in one month. I advised him to call or return sooner if he has any questions or concerns related to his recovery or treatment. ________________________________  Sheral Apley. Tammi Klippel, M.D.

## 2021-10-15 ENCOUNTER — Other Ambulatory Visit: Payer: Federal, State, Local not specified - PPO

## 2021-10-23 ENCOUNTER — Ambulatory Visit
Admission: RE | Admit: 2021-10-23 | Discharge: 2021-10-23 | Disposition: A | Discharge status: Home / Self Care | Payer: Federal, State, Local not specified - PPO | Source: Ambulatory Visit | Attending: Internal Medicine | Admitting: Internal Medicine

## 2021-10-23 DIAGNOSIS — R7989 Other specified abnormal findings of blood chemistry: Secondary | ICD-10-CM

## 2021-10-30 ENCOUNTER — Ambulatory Visit
Admission: RE | Admit: 2021-10-30 | Discharge: 2021-10-30 | Disposition: A | Discharge status: Home / Self Care | Payer: Federal, State, Local not specified - PPO | Source: Ambulatory Visit | Attending: Gastroenterology | Admitting: Gastroenterology

## 2021-10-30 DIAGNOSIS — K769 Liver disease, unspecified: Secondary | ICD-10-CM

## 2021-10-30 MED ORDER — GADOBENATE DIMEGLUMINE 529 MG/ML IV SOLN
17.0000 mL | Freq: Once | INTRAVENOUS | Status: AC | PRN
  Administered 2021-10-30: 17 mL via INTRAVENOUS

## 2021-11-20 ENCOUNTER — Other Ambulatory Visit: Payer: Self-pay

## 2021-11-20 NOTE — Progress Notes (Signed)
The proposed treatment discussed in conference is for discussion purpose only and is not a binding recommendation.  The patients have not been physically examined, or presented with their treatment options.  Therefore, final treatment plans cannot be decided.  

## 2022-10-16 ENCOUNTER — Other Ambulatory Visit (HOSPITAL_COMMUNITY): Payer: Self-pay | Admitting: Surgery

## 2022-10-16 DIAGNOSIS — D134 Benign neoplasm of liver: Secondary | ICD-10-CM

## 2022-10-25 ENCOUNTER — Ambulatory Visit (HOSPITAL_COMMUNITY)
Admission: RE | Admit: 2022-10-25 | Discharge: 2022-10-25 | Disposition: A | Discharge status: Home / Self Care | Payer: Federal, State, Local not specified - PPO | Source: Ambulatory Visit | Attending: Surgery | Admitting: Surgery

## 2022-10-25 DIAGNOSIS — D134 Benign neoplasm of liver: Secondary | ICD-10-CM | POA: Diagnosis present

## 2022-10-25 MED ORDER — GADOBUTROL 1 MMOL/ML IV SOLN
7.0000 mL | Freq: Once | INTRAVENOUS | Status: AC | PRN
  Administered 2022-10-25: 7 mL via INTRAVENOUS

## 2022-12-02 IMAGING — US US ABDOMEN LIMITED
1 series · 13 of 25 positions shown · non-contrast
Comparison: Abdominal CT 07/20/2020, MRI 10/29/2020

CLINICAL DATA: Liver lesion.

EXAM:
ULTRASOUND ABDOMEN LIMITED RIGHT UPPER QUADRANT

[Series 1: us abdomen limited · 0.25mm/px · 13 of 61 slices shown]
[im 1/61]
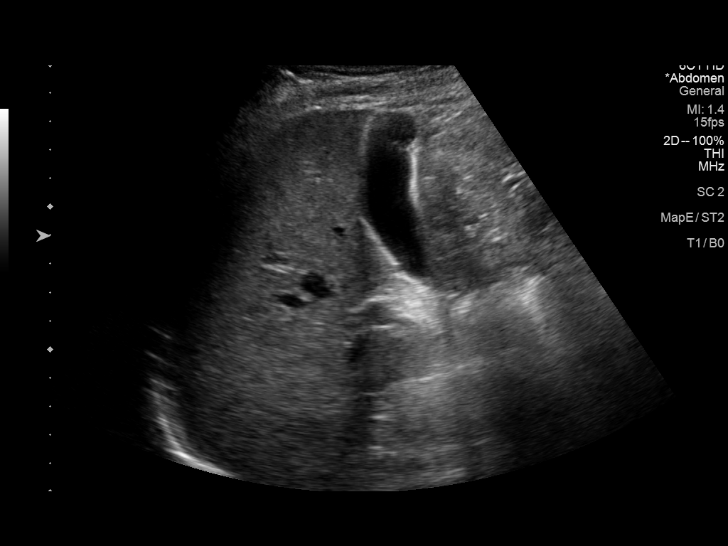
[im 6/61]
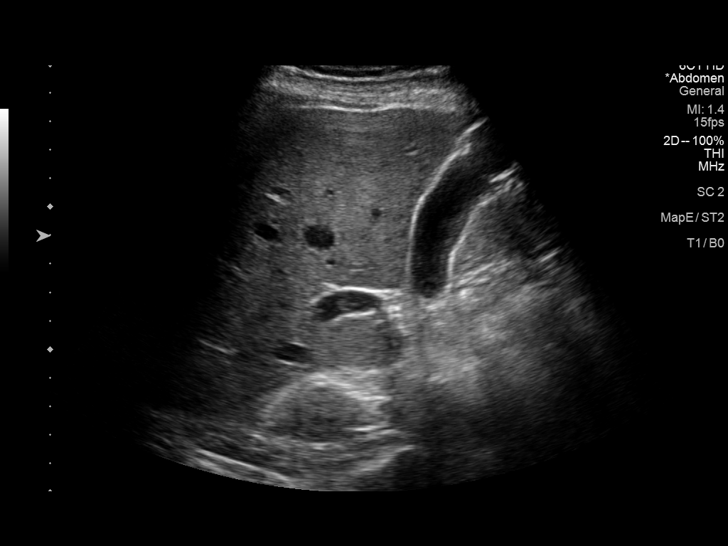
[im 11/61]
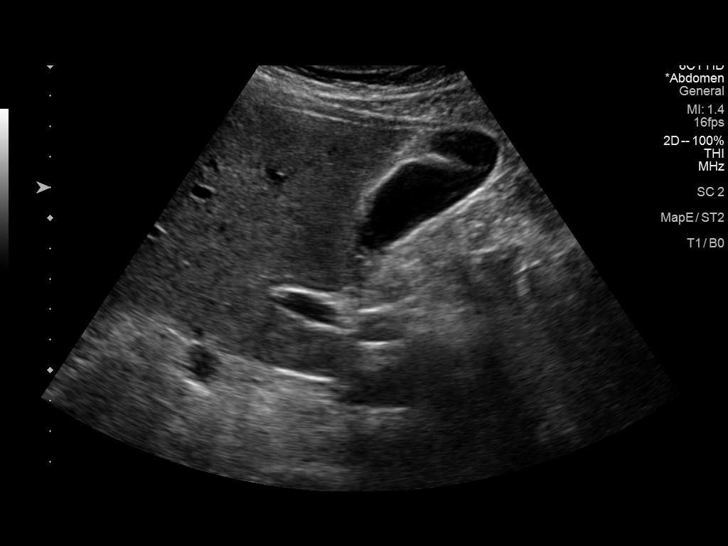
[im 16/61]
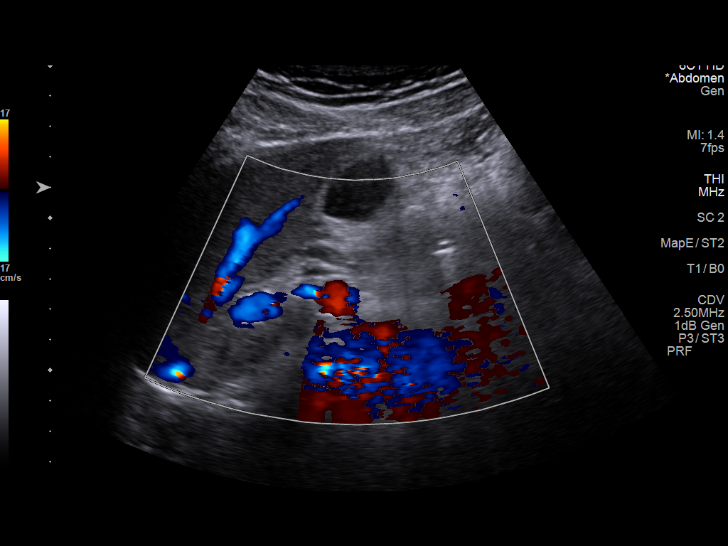
[im 21/61]
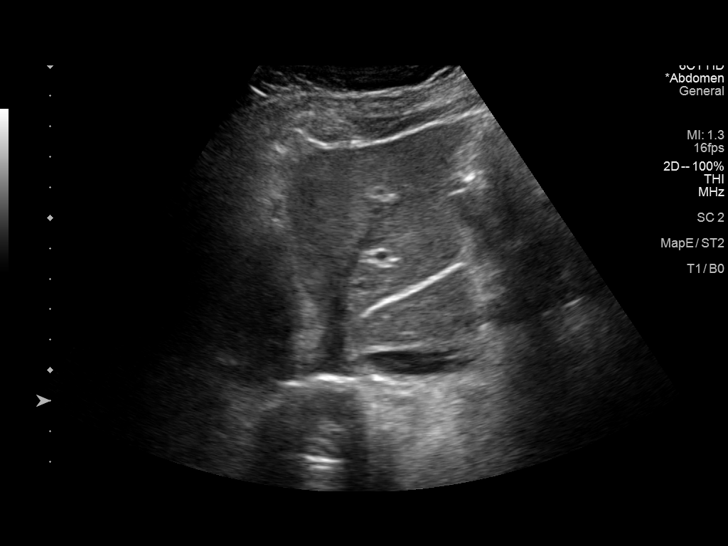
[im 26/61]
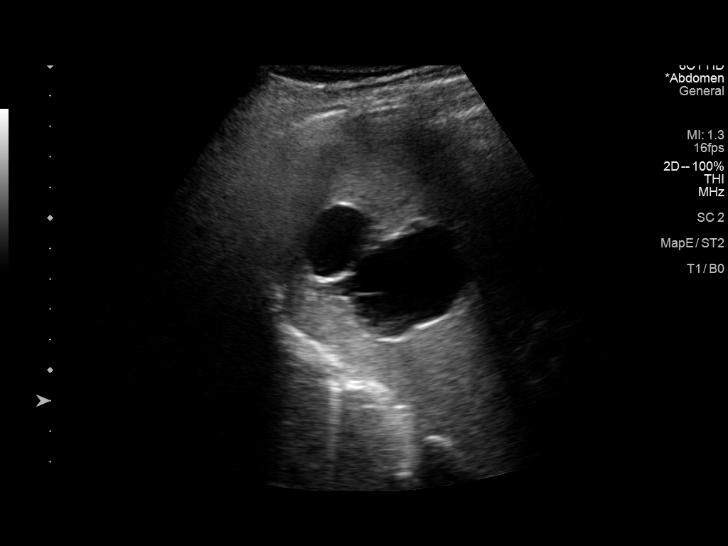
[im 31/61]
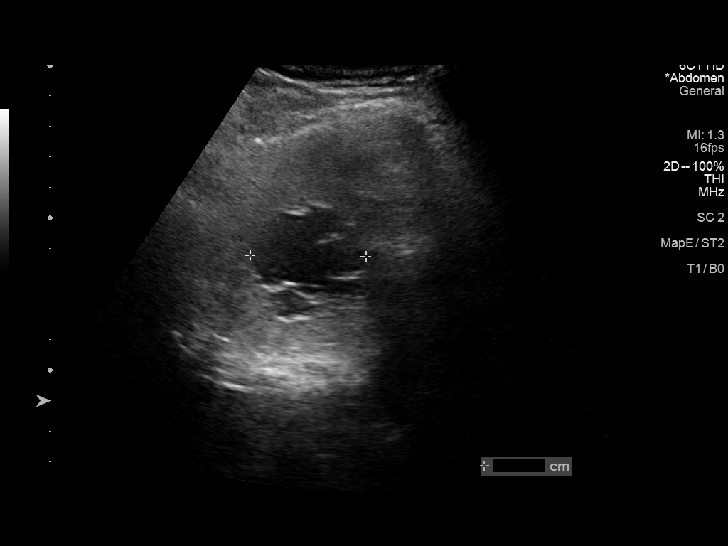
[im 36/61]
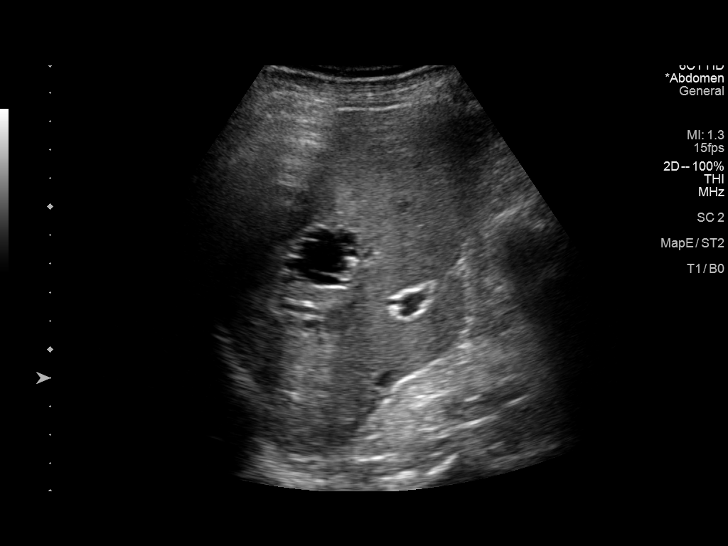
[im 41/61]
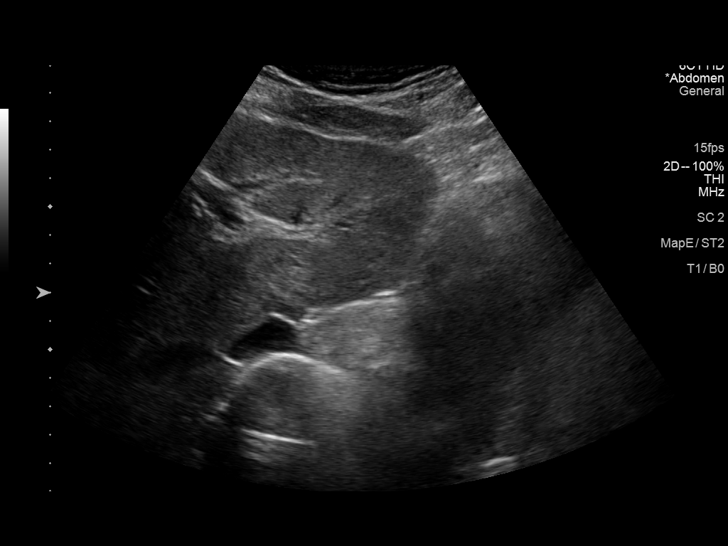
[im 46/61]
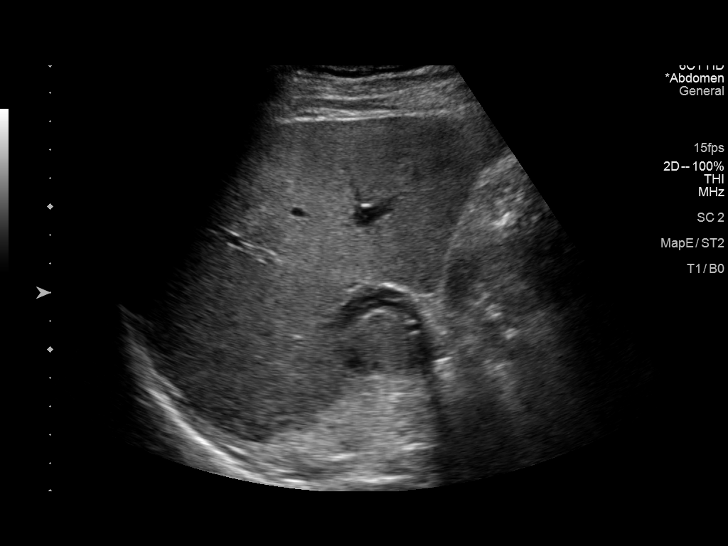
[im 51/61]
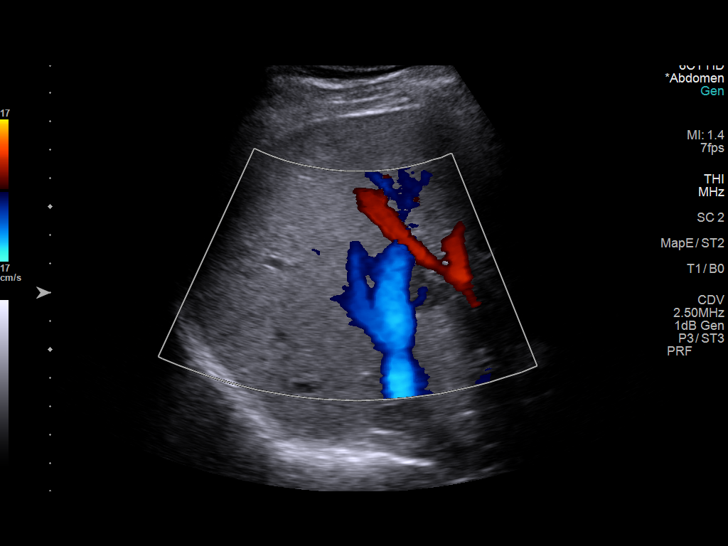
[im 56/61]
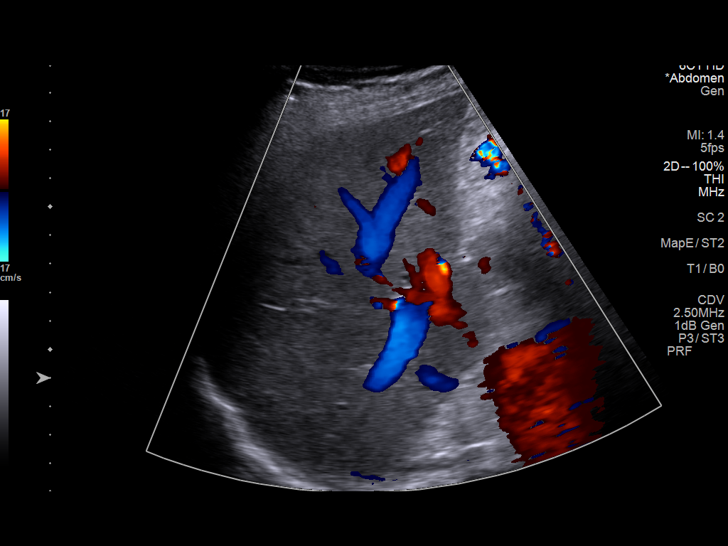
[im 61/61]
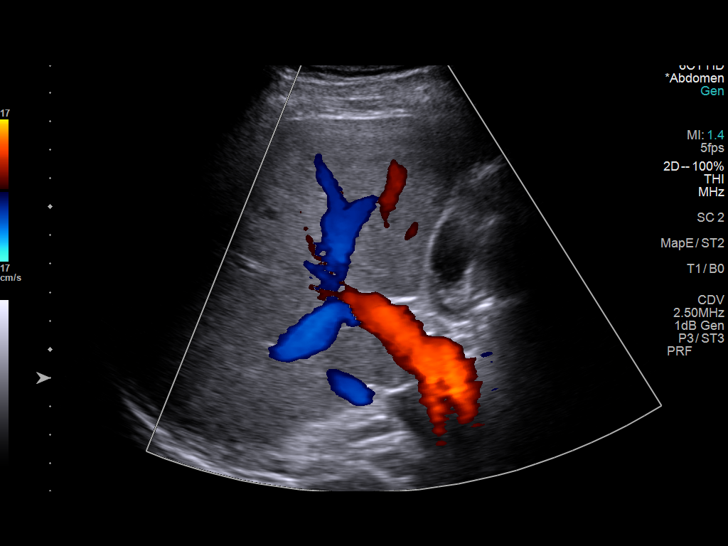

[13 of 25 positions shown; findings below may reference images not displayed]

FINDINGS: Gallbladder:

Partially distended. No gallstones or wall thickening visualized. No
sonographic Murphy sign noted by sonographer.

Common bile duct:

Diameter: 6-8 mm, upper normal for patient's age. Technically
limited assessment distal to the porta hepatis.

Liver:

Cystic lesion in the right hepatic lobe. This is irregular in shape
with lobulated contours and internal septations. Size is difficult
to accurately measure due to irregular morphology, when measured as
1 lesion on my measurements measures 5.4 x 4.1 x 4.3 cm (volume = 50
cm^3) (measured by technologist as 2 separate adjacent cystic
lesions). No definite nodularity by ultrasound. Heterogeneity in the
caudate without discrete lesion. No definite correlate to the focus
of enhancement in the right lobe on prior MRI. Portal vein is patent
on color Doppler imaging with normal direction of blood flow towards
the liver.

Other: No right upper quadrant ascites.
IMPRESSION: 1. Septated cystic lesion in the right lobe of the liver. No
definite nodularity by ultrasound. Size comparison with prior exams
is technically challenging given differences in modality, however
there is possibly slight increase in size from prior MRI.
2. No sonographic correlate to the focus of arterial enhancement in
the right hepatic lobe on prior MRI.

## 2023-02-24 IMAGING — NM NM BONE WHOLE BODY
2 series · 2 of 2 positions shown · non-contrast
Comparison: 04/24/2021

CLINICAL DATA: Prostate cancer

EXAM:
NUCLEAR MEDICINE WHOLE BODY BONE SCAN
TECHNIQUE: Whole body anterior and posterior images were obtained approximately
3 hours after intravenous injection of radiopharmaceutical.
RADIOPHARMACEUTICALS:  22.0 mCi Gechnetium-44m MDP IV

[Series 1: whole body · 2.66mm/px · 1 of 1 slices shown (1 of 2)]
[im 1/1]
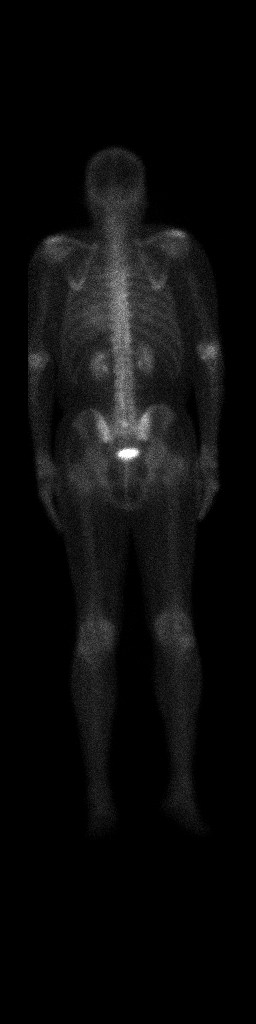

[Series 1: whole body · 2.66mm/px · 1 of 1 slices shown (2 of 2)]
[im 1/1]
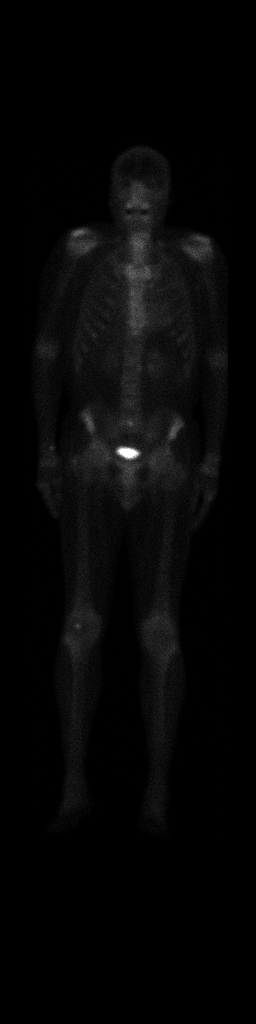

[2 of 2 positions shown; findings below may reference images not displayed]

FINDINGS: Anterior and posterior whole body planar images are obtained after
radiotracer administration. Physiologic excretion of radiotracer
within the kidneys and bladder. Mild uptake at the bilateral
shoulders, bilateral wrist, lumbosacral junction, and right knee
consistent with degenerative change.

No radiotracer uptake to suggest an acute or destructive process.
Specifically, no evidence of bony metastases.
IMPRESSION: 1. Mild degenerative changes as above. No evidence of bony
metastases.

## 2023-06-09 ENCOUNTER — Other Ambulatory Visit: Payer: Self-pay | Admitting: Surgery

## 2023-06-09 DIAGNOSIS — D134 Benign neoplasm of liver: Secondary | ICD-10-CM

## 2023-06-29 ENCOUNTER — Ambulatory Visit: Payer: Federal, State, Local not specified - PPO | Attending: Cardiology | Admitting: Cardiology

## 2023-06-29 ENCOUNTER — Encounter: Payer: Self-pay | Admitting: Cardiology

## 2023-06-29 VITALS — BP 132/82 | HR 96 | Ht 70.5 in | Wt 209.0 lb

## 2023-06-29 DIAGNOSIS — R9431 Abnormal electrocardiogram [ECG] [EKG]: Secondary | ICD-10-CM | POA: Diagnosis not present

## 2023-06-29 DIAGNOSIS — E782 Mixed hyperlipidemia: Secondary | ICD-10-CM | POA: Diagnosis not present

## 2023-06-29 NOTE — Patient Instructions (Signed)
 Testing/Procedures: ECHO   Your physician has requested that you have an echocardiogram. Echocardiography is a painless test that uses sound waves to create images of your heart. It provides your doctor with information about the size and shape of your heart and how well your heart's chambers and valves are working. This procedure takes approximately one hour. There are no restrictions for this procedure. Please do NOT wear cologne, perfume, aftershave, or lotions (deodorant is allowed). Please arrive 15 minutes prior to your appointment time.  Please note: We ask at that you not bring children with you during ultrasound (echo/ vascular) testing. Due to room size and safety concerns, children are not allowed in the ultrasound rooms during exams. Our front office staff cannot provide observation of children in our lobby area while testing is being conducted. An adult accompanying a patient to their appointment will only be allowed in the ultrasound room at the discretion of the ultrasound technician under special circumstances. We apologize for any inconvenience.    Follow-Up: At [facility], you and your health needs are our priority.  As part of our continuing mission to provide you with exceptional heart care, we have created designated Provider Care Teams.  These Care Teams include your primary Cardiologist (physician) and Advanced Practice Providers (APPs -  Physician Assistants and Nurse Practitioners) who all work together to provide you with the care you need, when you need it.   Your next appointment:   AS NEEDED   Provider:   Elder Negus, MD     Other Instructions   1st Floor: - Lobby - Registration  - Pharmacy  - Lab - Cafe  2nd Floor: - PV Lab - Diagnostic Testing (echo, CT, nuclear med)  3rd Floor: - Vacant  4th Floor: - TCTS (cardiothoracic surgery) - AFib Clinic - Structural Heart Clinic - Vascular Surgery  - Vascular Ultrasound  5th  Floor: - [facility] (general and EP) - Clinical Pharmacy for coumadin, hypertension, lipid, weight-loss medications, and med management appointments    Valet parking services will be available as well.

## 2023-06-29 NOTE — Progress Notes (Signed)
  Cardiology Office Note:  .   Date:  06/29/2023  ID:  Johnny Howard, DOB 05/27/1945, MRN [id number] PCP: Ralene Ok, MD  [facility] Providers Cardiologist:  Truett Mainland, MD PCP: Ralene Ok, MD  Chief Complaint  Patient presents with   Abnormal ECG      History of Present Illness: .    Johnny Howard is a 78 y.o. male with hyperlipidemia, CKD 3a, h/o prostate cancer, referred for abnormal EKG  Patient has history of prostate cancer.  Physical activity has gone down over the last year or so due to prostate cancer, denies any complaints of chest pain, shortness of breath etc.  He is on atorvastatin, lipid panel reviewed below.  He has occasional lightheadedness.  Vitals:   06/29/23 0855  BP: 132/82  Pulse: 96  SpO2: 97%     ROS:  Review of Systems  Cardiovascular:  Negative for chest pain, dyspnea on exertion, leg swelling, palpitations and syncope.     Studies Reviewed: Marland Kitchen        EKG 06/29/2023: Normal sinus rhythm 96 bpm Septal infarct (cited on or before 11-Jul-2021) When compared with ECG of 11-Jul-2021 07:58, No significant change was found    Independently interpreted 03/2023: Chol 192, TG 55, HDL 88, LDL 94 HbA1C 5.8% Hb 15.4 Cr 1.4, eGFR 51    Physical Exam:   Physical Exam Vitals and nursing note reviewed.  Constitutional:      General: He is not in acute distress. Neck:     Vascular: No JVD.  Cardiovascular:     Rate and Rhythm: Normal rate and regular rhythm.     Heart sounds: Normal heart sounds. No murmur heard. Pulmonary:     Effort: Pulmonary effort is normal.     Breath sounds: Normal breath sounds. No wheezing or rales.  Musculoskeletal:     Right lower leg: No edema.     Left lower leg: No edema.      VISIT DIAGNOSES:   ICD-10-CM   1. Abnormal EKG  R94.31 EKG 12-Lead    ECHOCARDIOGRAM COMPLETE    2. Mixed hyperlipidemia  E78.2        ASSESSMENT AND PLAN: .    Johnny Howard is a 78 y.o. male with  hyperlipidemia, CKD 3a, h/o prostate cancer, referred for abnormal EKG  Abnormal EKG: Possible septal infarct on EKG.  No symptoms to suggest prior MI.  No symptoms of angina or dyspnea at this time.  Will check echocardiogram to evaluate any structural or functional abnormality.  If echocardiogram normal, I do not think he needs any further workup at this time.  Mixed hyperlipidemia: Continue Lipitor 40 mg daily.  CKD stage 3a: Follow-up with Dr. Lajuana Ripple.     F/u as needed  Signed, Elder Negus, MD

## 2023-07-12 ENCOUNTER — Ambulatory Visit
Admission: RE | Admit: 2023-07-12 | Discharge: 2023-07-12 | Disposition: A | Discharge status: Home / Self Care | Payer: Federal, State, Local not specified - PPO | Source: Ambulatory Visit | Attending: Surgery

## 2023-07-12 DIAGNOSIS — D134 Benign neoplasm of liver: Secondary | ICD-10-CM

## 2023-07-12 MED ORDER — GADOPICLENOL 0.5 MMOL/ML IV SOLN
9.0000 mL | Freq: Once | INTRAVENOUS | Status: AC | PRN
  Administered 2023-07-12: 9 mL via INTRAVENOUS

## 2023-07-30 ENCOUNTER — Ambulatory Visit (HOSPITAL_COMMUNITY): Attending: Internal Medicine

## 2023-07-30 DIAGNOSIS — R9431 Abnormal electrocardiogram [ECG] [EKG]: Secondary | ICD-10-CM | POA: Insufficient documentation

## 2023-07-30 LAB — ECHOCARDIOGRAM COMPLETE
Area-P 1/2: 4.31 cm2
S' Lateral: 2.3 cm

## 2023-07-31 NOTE — Progress Notes (Signed)
 Findings on echocardiogram suggest possible cardiac amyloidosis.  Ideally, recommend further workup with cardiac MRI.  If patient has more questions regarding the findings and cardiac MRI, consider scheduling an office visit to discuss further.  I am not in the office for next 2 weeks, happy to see after I return or patient can see another provider before then.  Please forward echocardiogram findings are my note above to PCP Dr. Ludwig Clarks as well.  Thanks MJP

## 2023-08-25 ENCOUNTER — Ambulatory Visit: Attending: Cardiology | Admitting: Cardiology

## 2023-08-25 ENCOUNTER — Encounter: Payer: Self-pay | Admitting: Cardiology

## 2023-08-25 ENCOUNTER — Encounter: Payer: Self-pay | Admitting: Neurology

## 2023-08-25 VITALS — BP 134/86 | HR 96 | Ht 70.5 in | Wt 168.0 lb

## 2023-08-25 DIAGNOSIS — R202 Paresthesia of skin: Secondary | ICD-10-CM

## 2023-08-25 DIAGNOSIS — I517 Cardiomegaly: Secondary | ICD-10-CM

## 2023-08-25 LAB — CBC
Hematocrit: 48.6 % (ref 37.5–51.0)
Hemoglobin: 16.1 g/dL (ref 13.0–17.7)
MCH: 29.3 pg (ref 26.6–33.0)
MCHC: 33.1 g/dL (ref 31.5–35.7)
MCV: 89 fL (ref 79–97)
Platelets: 193 10*3/uL (ref 150–450)
RBC: 5.49 x10E6/uL (ref 4.14–5.80)
RDW: 13.8 % (ref 11.6–15.4)
WBC: 6.6 10*3/uL (ref 3.4–10.8)

## 2023-08-25 NOTE — Progress Notes (Addendum)
 Cardiology Office Note:  .   Date:  08/25/2023  ID:  Johnny Howard, DOB [birth year], MRN [id number] PCP: Valma Carwin, MD  [facility] Providers Cardiologist:  Newman Lawrence, MD PCP: Valma Carwin, MD  Chief Complaint  Patient presents with   Hyperlipidemia   Abnormal ECG      History of Present Illness: .    Johnny Howard is a 78 y.o. male with hyperlipidemia, CKD 3a, h/o prostate cancer, referred for abnormal EKG  Patient is retired Armed forces logistics/support/administrative officer, worked for [employer] for [employer] for many years.  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  He stays active with all projects for the blind and hearing impaired, travels internationally for conventions etc.  Originally saw him for abnormal EKG, without any symptoms of chest pain or dyspnea.  Echocardiogram showed severe hypertrophy with cherry on top appearance on strain imaging, concerning for amyloidosis.  On further questioning, patient reports tingling, numbness with pain in both arms only at night, resolves completely during daytime.  He reports that his father had congestive heart failure, died at the age of 45.  No other known family history of heart failure.  He has 2 daughters.  Vitals:   08/25/23 1135  BP: 134/86  Pulse: 96  SpO2: 97%      ROS:  Review of Systems  Cardiovascular:  Negative for chest pain, dyspnea on exertion, leg swelling, palpitations and syncope.     Studies Reviewed: SABRA        EKG 06/29/2023: Normal sinus rhythm 96 bpm Septal infarct (cited on or before 11-Jul-2021) When compared with ECG of 11-Jul-2021 07:58, No significant change was found    Independently interpreted 03/2023: Chol 192, TG 55, HDL 88, LDL 94 HbA1C 5.8% Hb 15.4 Cr 1.4, eGFR 51  Echocardiogram 07/2023: Severe LVH with septal prominence. GLS pattern reveals apical sparing (cherry-on-top) suggestive of cardaic amyloid.    Physical Exam:   Physical Exam Vitals and nursing note reviewed.   Constitutional:      General: He is not in acute distress. Neck:     Vascular: No JVD.  Cardiovascular:     Rate and Rhythm: Normal rate and regular rhythm.     Heart sounds: Normal heart sounds. No murmur heard. Pulmonary:     Effort: Pulmonary effort is normal.     Breath sounds: Normal breath sounds. No wheezing or rales.  Musculoskeletal:     Right lower leg: No edema.     Left lower leg: No edema.      VISIT DIAGNOSES:   ICD-10-CM   1. LVH (left ventricular hypertrophy)  I51.7 MR CARDIAC MORPHOLOGY W WO CONTRAST    CBC    2. Paresthesia  R20.2 Ambulatory referral to Neurology        ASSESSMENT AND PLAN: .    Johnny Howard is a 78 y.o. 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  He has bilateral upper extremity paresthesias, concerning for carpal tunnel syndrome.  He denies any other significant constitutional symptoms.  Of note, he reports that his weight was erroneously reported as 209 pounds in first visit, was actually 179 pounds.  Since then, he has lost a few pounds since then with cutting down on sugar related calorie intake.  At this time, he does not have  any heart failure symptoms.  He does have family history of congestive heart failure in his father.  Recommend cardiac MRI for further evaluation.  If cardiac MRI shows LGE pattern suggestive of fibrosis, I will refer him to heart failure specialist for consideration for SPEP, UPEP, as well as PYP scan.  In addition, given his bilateral paresthesia symptoms concerning for carpal tunnel syndrome in the setting of suspected amyloidosis, I will refer him to neurologist Dr. Tonita Blanch, who specializes in neuromuscular evaluation of patients with amyloidosis.  I explained to the patient  that he would likely have minimal restrictions on his international travel.   F/u in 6 months  Signed, Newman JINNY Lawrence, MD

## 2023-08-25 NOTE — Patient Instructions (Addendum)
 Lab Work: CBC  If you have labs (blood work) drawn today and your tests are completely normal, you will receive your results only by: MyChart Message (if you have MyChart) OR A paper copy in the mail If you have any lab test that is abnormal or we need to change your treatment, we will call you to review the results.  Testing/Procedures: CARDIAC MRI  Your physician has requested that you have a cardiac MRI. Cardiac MRI uses a computer to create images of your heart as its beating, producing both still and moving pictures of your heart and major blood vessels. For further information please visit [email or web address]. Please follow the instruction sheet given to you today for more information.   Follow-Up: At [facility], you and your health needs are our priority.  As part of our continuing mission to provide you with exceptional heart care, our providers are all part of one team.  This team includes your primary Cardiologist (physician) and Advanced Practice Providers or APPs (Physician Assistants and Nurse Practitioners) who all work together to provide you with the care you need, when you need it.  REFERRAL TO NEUROLOGY   Your next appointment:   6 month(s)  Provider:   Cody Das, MD      You are scheduled for Cardiac MRI at the location below.    [facility] [address] [place], [place] [address] Please take advantage of the free valet parking available at the [facility] and [employer] (Entrance C).  Proceed to the [facility] Radiology Department (First Floor) for check-in.   Magnetic resonance imaging (MRI) is a painless test that produces images of the inside of the body without using Xrays.  During an MRI, strong magnets and radio waves work together in a Data processing manager to form detailed images.   MRI images may provide more details about a medical condition than X-rays, CT scans, and ultrasounds can provide.  You may be given  earphones to listen for instructions.  You may eat a light breakfast and take medications as ordered with the exception of furosemide, hydrochlorothiazide, chlorthalidone or spironolactone (or any other fluid pill). If you are undergoing a stress MRI, please avoid stimulants for 12 hr prior to test. (I.e. Caffeine, nicotine, chocolate, or antihistamine medications)  If your provider has ordered anti-anxiety medications for this test, then you will need a driver.  An IV will be inserted into one of your veins. Contrast material will be injected into your IV. It will leave your body through your urine within a day. You may be told to drink plenty of fluids to help flush the contrast material out of your system.  You will be asked to remove all metal, including: Watch, jewelry, and other metal objects including hearing aids, hair pieces and dentures. Also wearable glucose monitoring systems (ie. Freestyle Libre and Omnipods) (Braces and fillings normally are not a problem.)   TEST WILL TAKE APPROXIMATELY 1 HOUR  PLEASE NOTIFY SCHEDULING AT LEAST 24 HOURS IN ADVANCE IF YOU ARE UNABLE TO KEEP YOUR APPOINTMENT. [phone]  For more information and frequently asked questions, please visit our website : [email or web address]  Please call the Cardiac Imaging Nurse Navigators with any questions/concerns. [phone] Office

## 2023-08-31 ENCOUNTER — Encounter (HOSPITAL_COMMUNITY): Payer: Self-pay

## 2023-09-01 ENCOUNTER — Ambulatory Visit (HOSPITAL_COMMUNITY)
Admission: RE | Admit: 2023-09-01 | Discharge: 2023-09-01 | Disposition: A | Discharge status: Home / Self Care | Source: Ambulatory Visit | Attending: Cardiology | Admitting: Cardiology

## 2023-09-01 ENCOUNTER — Other Ambulatory Visit: Payer: Self-pay | Admitting: Cardiology

## 2023-09-01 DIAGNOSIS — I517 Cardiomegaly: Secondary | ICD-10-CM

## 2023-09-01 MED ORDER — GADOBUTROL 1 MMOL/ML IV SOLN
10.0000 mL | Freq: Once | INTRAVENOUS | Status: AC | PRN
  Administered 2023-09-01: 10 mL via INTRAVENOUS

## 2023-09-02 ENCOUNTER — Ambulatory Visit: Payer: Self-pay | Admitting: Cardiology

## 2023-09-02 DIAGNOSIS — E854 Organ-limited amyloidosis: Secondary | ICD-10-CM

## 2023-09-02 NOTE — Progress Notes (Signed)
 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  Please make a referral to see heart failure specialist to discuss additional testing and treatment options, diagnosis: Cardiac amyloidosis.  Fortunately, patient does not have any signs or symptoms of heart failure at this time.  Thanks MJP

## 2023-10-20 ENCOUNTER — Other Ambulatory Visit

## 2023-10-20 ENCOUNTER — Ambulatory Visit: Admitting: Neurology

## 2023-10-20 ENCOUNTER — Encounter: Payer: Self-pay | Admitting: Neurology

## 2023-10-20 VITALS — BP 116/75 | HR 86 | Ht 70.5 in | Wt 172.0 lb

## 2023-10-20 DIAGNOSIS — R202 Paresthesia of skin: Secondary | ICD-10-CM

## 2023-10-20 DIAGNOSIS — R200 Anesthesia of skin: Secondary | ICD-10-CM

## 2023-10-20 DIAGNOSIS — E859 Amyloidosis, unspecified: Secondary | ICD-10-CM | POA: Diagnosis not present

## 2023-10-20 NOTE — Patient Instructions (Signed)
 We will perform genetics testing to check for hereditary amyloidosis, as well as blood work looking for other causes of tingling.   We will see you back for nerve testing of both arms.  ELECTROMYOGRAM AND NERVE CONDUCTION STUDIES (EMG/NCS) INSTRUCTIONS  How to Prepare The neurologist conducting the EMG will need to know if you have certain medical conditions. Tell the neurologist and other EMG lab personnel if you: Have a pacemaker or any other electrical medical device Take blood-thinning medications Have hemophilia, a blood-clotting disorder that causes prolonged bleeding Bathing Take a shower or bath shortly before your exam in order to remove oils from your skin. Don't apply lotions or creams before the exam.  What to Expect You'll likely be asked to change into a hospital gown for the procedure and lie down on an examination table. The following explanations can help you understand what will happen during the exam.  Electrodes. The neurologist or a technician places surface electrodes at various locations on your skin depending on where you're experiencing symptoms. Or the neurologist may insert needle electrodes at different sites depending on your symptoms.  Sensations. The electrodes will at times transmit a tiny electrical current that you may feel as a twinge or spasm. The needle electrode may cause discomfort or pain that usually ends shortly after the needle is removed. If you are concerned about discomfort or pain, you may want to talk to the neurologist about taking a short break during the exam.  Instructions. During the needle EMG, the neurologist will assess whether there is any spontaneous electrical activity when the muscle is at rest - activity that isn't present in healthy muscle tissue - and the degree of activity when you slightly contract the muscle.  He or she will give you instructions on resting and contracting a muscle at appropriate times. Depending on what muscles and  nerves the neurologist is examining, he or she may ask you to change positions during the exam.  After your EMG You may experience some temporary, minor bruising where the needle electrode was inserted into your muscle. This bruising should fade within several days. If it persists, contact your primary care doctor.

## 2023-10-20 NOTE — Progress Notes (Signed)
 [facility] Neurology Division Clinic Note - Initial Visit   Date: 10/20/2023   Johnny Howard MRN: [id number] DOB: Sep 06, 1945   Dear Dr. Elmira:  Thank you for your kind referral of Johnny Howard for consultation of bilateral hand numbness/tingling. Although his history is well known to you, please allow us  to reiterate it for the purpose of our medical record. The patient was accompanied to the clinic by self.    Johnny Howard is a 78 y.o. right-handed male with hyperlipidemia, prediabetes, prostate cancer s/p resection and radiation, and cardiac amyloidosis presenting for evaluation of bilateral hand numbness.   IMPRESSION/PLAN: Bilateral hand paresthesias. With his history of cardiac amyloidosis, entrapment neuropathy, particularly carpal tunnel syndrome is highly possible.  His neurological exam is remarkably intact, with only mild difficulty with tandem gait.  Tinel's sign at the wrist is absent bilaterally.  To further evaluate his hand paresthesias, additional testing will include:  - Check TSH, vitamin B12, folate, vitamin B1, SPEP with IFE  - NCS/EMG of bilateral arms  - Genetic testing for ATTR through [facility] has been collected today  Return to clinic in 3 months, or sooner as needed  ------------------------------------------------------------- History of present illness: For the past year, he has been waking up with his arms and hands falling asleep.  It lasts about 15-30 min and then improves.  He has tried soaking the hands in warm water, shaking the hands, and repositioning.  He denies hand weakness, neck pain, or radicular arm pain.  He does not have numbness/tingling of the feet, imbalance, leg weakness, or history of falls.   Nonsmoker.  He drinks a glass of wine 3-4 times per week.  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  He lives at home with wife.  He has two grown daughters.  His younger  sister has carpal tunnel syndrome.  Father died of CHF at the age of almost 31.   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   Hyperlipidemia    Microscopic hematuria 04/01/2021   Numbness of left hand 07/08/2021   on occasional per pt no known carpal tunnel   Pre-diabetes    Prostate cancer (HCC)    Urinary frequency 01/29/2021   Wears glasses for reading     Past Surgical History:  Procedure Laterality Date   BACK SURGERY  Nov 05, 1995   lower back   colonscopy     11-04-17 or 11-05-2018   CYSTOSCOPY  08/01/2020   GOLD SEED IMPLANT N/A 07/11/2021   Procedure: GOLD SEED IMPLANT;  Surgeon: Matilda Senior, MD;  Location[facility];  Service: Urology;  Laterality: N/A;   Prostate Needle Biospsy  04/01/2021   also 2015   SPACE OAR INSTILLATION N/A 07/11/2021   Procedure: SPACE OAR INSTILLATION;  Surgeon: Matilda Senior, MD;  Location: [facility];  Service: Urology;  Laterality: N/A;     Medications:  Outpatient Encounter Medications as of 10/20/2023  Medication Sig   atorvastatin (LIPITOR) 40 MG tablet Take 40 mg by mouth daily.   finasteride (PROSCAR)  5 MG tablet Take 5 mg by mouth daily.   psyllium (METAMUCIL) 58.6 % packet Take 1 packet by mouth daily. 3  heaping teaspooons daily   sildenafil (VIAGRA) 50 MG tablet 60MG  1 tablet as needed   No facility-administered encounter medications on file as of 10/20/2023.    Allergies: No Known Allergies  Family History: Family History  Problem Relation Age of Onset   Heart disease Mother 38   Heart failure Father 88   Carpal tunnel syndrome Sister    Stroke Maternal Grandmother      Social History: Social History   Tobacco Use   Smoking status: Former    Current packs/day: 0.00    Average packs/day: 1 pack/day for 7.0 years (7.0 ttl pk-yrs)    Types: Cigarettes    Start date: 04/21/1962    Quit date: 04/21/1969    Years since quitting: 54.5  Vaping Use   Vaping status: Never Used  Substance Use Topics   Alcohol use: Yes    Comment: Glass of wine every other day   Drug use: Not Currently   Social History   Social History Narrative   Are you right handed or left handed? Right Handed    Are you currently employed ? No   What is your current occupation? No    Do you live at home alone? No    Who lives with you? Wife    What type of home do you live in: 1 story or 2 story? Two story home           Vital Signs:  BP 116/75   Pulse 86   Ht 5' 10.5 (1.791 m)   Wt 172 lb (78 kg)   SpO2 99%   BMI 24.33 kg/m   Neurological Exam: MENTAL STATUS including orientation to time, place, person, recent and remote memory, attention span and concentration, language, and fund of knowledge is normal.  Speech is not dysarthric.  CRANIAL NERVES: II:  No visual field defects.     III-IV-VI: Pupils equal round and reactive to light.  Normal conjugate, extra-ocular eye movements in all directions of gaze.  No nystagmus.  No ptosis.   V:  Normal facial sensation.    VII:  Normal facial symmetry and movements.   VIII:  Normal hearing and vestibular function.   IX-X:  Normal palatal movement.   XI:  Normal shoulder shrug and head rotation.   XII:  Normal tongue strength and range of motion, no deviation or fasciculation.  MOTOR:  No atrophy, fasciculations or abnormal movements.  No pronator drift.   Upper Extremity:  Right  Left  Deltoid  5/5   5/5   Biceps  5/5   5/5   Triceps  5/5   5/5   Wrist extensors  5/5   5/5   Wrist flexors  5/5   5/5   Finger extensors  5/5   5/5   Finger flexors  5/5   5/5   Dorsal interossei  5/5   5/5   Abductor pollicis  5/5    5/5   Tone (Ashworth scale)  0  0   Lower Extremity:  Right  Left  Hip flexors  5/5   5/5   Knee flexors  5/5   5/5   Knee extensors  5/5   5/5   Dorsiflexors  5/5   5/5   Plantarflexors  5/5   5/5   Toe extensors  5/5   5/5  Toe flexors  5/5   5/5   Tone (Ashworth scale)  0  0   MSRs:                                           Right        Left brachioradialis 2+  2+  biceps 2+  2+  triceps 2+  2+  patellar 2+  2+  ankle jerk 2+  2+  plantar response down  down   SENSORY:  Normal and symmetric perception of light touch, pinprick, vibration, and temperature.  Romberg's sign absent.  Tinel's sign at the wrist is absent.   COORDINATION/GAIT: Normal finger-to- nose-finger.  Intact rapid alternating movements bilaterally.  Gait narrow based and stable.  Mild unsteadiness with tandem gait, but able to perform.  Stressed gait is intact.    Thank you for allowing me to participate in patient's care.  If I can answer any additional questions, I would be pleased to do so.    Sincerely,    Nashanti Duquette K. Tobie, DO

## 2023-10-21 NOTE — Progress Notes (Signed)
 Spoke with heart failure clinic and they will call the patient today to get him scheduled.

## 2023-10-26 LAB — IMMUNOFIXATION ELECTROPHORESIS
IgG (Immunoglobin G), Serum: 1503 mg/dL (ref 600–1540)
IgM, Serum: 97 mg/dL (ref 50–300)
Immunoglobulin A: 1503 mg/dL (ref 70–320)
Immunoglobulin A: 157 mg/dL (ref 70–320)

## 2023-10-26 LAB — PROTEIN ELECTROPHORESIS, SERUM
Albumin ELP: 4.5 g/dL (ref 3.8–4.8)
Alpha 1: 0.3 g/dL (ref 0.2–0.3)
Alpha 2: 0.7 g/dL (ref 0.5–0.9)
Beta 2: 0.4 g/dL (ref 0.2–0.5)
Beta Globulin: 0.4 g/dL (ref 0.4–0.6)
Gamma Globulin: 1.4 g/dL (ref 0.8–1.7)
Total Protein: 7.7 g/dL (ref 6.1–8.1)

## 2023-10-26 LAB — B12 AND FOLATE PANEL
Folate: 17.9 ng/mL
Vitamin B-12: 587 pg/mL (ref 200–1100)

## 2023-10-26 LAB — TSH: TSH: 1.51 m[IU]/L (ref 0.40–4.50)

## 2023-10-26 LAB — VITAMIN B1: Vitamin B1 (Thiamine): 7 nmol/L — ABNORMAL LOW (ref 8–30)

## 2023-10-27 ENCOUNTER — Ambulatory Visit: Payer: Self-pay | Admitting: Neurology

## 2023-11-17 ENCOUNTER — Other Ambulatory Visit (HOSPITAL_COMMUNITY): Payer: Self-pay

## 2023-11-17 ENCOUNTER — Encounter (HOSPITAL_COMMUNITY): Payer: Self-pay | Admitting: Cardiology

## 2023-11-17 ENCOUNTER — Ambulatory Visit (HOSPITAL_COMMUNITY)
Admission: RE | Admit: 2023-11-17 | Discharge: 2023-11-17 | Disposition: A | Discharge status: Home / Self Care | Source: Ambulatory Visit | Attending: Cardiology | Admitting: Cardiology

## 2023-11-17 ENCOUNTER — Telehealth (HOSPITAL_COMMUNITY): Payer: Self-pay | Admitting: Pharmacist

## 2023-11-17 VITALS — BP 118/72 | HR 93 | Ht 70.5 in | Wt 172.2 lb

## 2023-11-17 DIAGNOSIS — I429 Cardiomyopathy, unspecified: Secondary | ICD-10-CM | POA: Diagnosis not present

## 2023-11-17 DIAGNOSIS — G629 Polyneuropathy, unspecified: Secondary | ICD-10-CM | POA: Diagnosis not present

## 2023-11-17 DIAGNOSIS — I509 Heart failure, unspecified: Secondary | ICD-10-CM | POA: Insufficient documentation

## 2023-11-17 DIAGNOSIS — R2689 Other abnormalities of gait and mobility: Secondary | ICD-10-CM | POA: Diagnosis not present

## 2023-11-17 DIAGNOSIS — E854 Organ-limited amyloidosis: Secondary | ICD-10-CM | POA: Diagnosis not present

## 2023-11-17 DIAGNOSIS — I43 Cardiomyopathy in diseases classified elsewhere: Secondary | ICD-10-CM | POA: Diagnosis not present

## 2023-11-17 DIAGNOSIS — E785 Hyperlipidemia, unspecified: Secondary | ICD-10-CM | POA: Diagnosis not present

## 2023-11-17 DIAGNOSIS — C61 Malignant neoplasm of prostate: Secondary | ICD-10-CM | POA: Diagnosis not present

## 2023-11-17 DIAGNOSIS — N1831 Chronic kidney disease, stage 3a: Secondary | ICD-10-CM | POA: Insufficient documentation

## 2023-11-17 NOTE — Progress Notes (Signed)
   ADVANCED HEART FAILURE NEW PATIENT CLINIC NOTE  Referring Physician: Valma Carwin, MD  Primary Care: Valma Carwin, MD Primary Cardiologist:  HPI: Johnny Howard is a 78 y.o. male with a PMH of HLD, CKD stage IIIa, recently diagnosed hATTR amyloid who presents for initial visit for further evaluation and treatment of heart failure/cardiomyopathy.      Retired Public affairs consultant. Overall minimal medical history, Originally seen for abnormal EKG, with excellent workup by Dr. Elmira underwent cardiac MRI showing significant concern for cardiac amyloid.      SUBJECTIVE: Patient reports that overall he is doing well. He has no active cardiac complaints. He denies any chest pain, shortness of breath, swelling, orthopnea, PND, orthostatic symptoms. He has taken minimal medications. He does have tingling and burning in his arms, mainly his hands, that happens most at night and occasionally keeps him up. Had a father who died from CHF at the age of 24.   PMH, current medications, allergies, social history, and family history reviewed in epic.  PHYSICAL EXAM: Vitals:   11/17/23 1035  BP: 118/72  Pulse: 93  SpO2: 99%   GENERAL: Well nourished and in no apparent distress at rest.  PULM:  Normal work of breathing, clear to auscultation bilaterally. Respirations are unlabored.  CARDIAC:  JVP: flat         Normal rate with regular rhythm. No murmurs, rubs or gallops.  No edema. Warm and well perfused extremities. ABDOMEN: Soft, non-tender, non-distended. NEUROLOGIC: Patient is oriented x3 with no focal or lateralizing neurologic deficits.    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 NYHA class I symptoms from a cardiac standpoint, but moderate discomfort and distress from upper extremity neuropathy as well as some mild gait imbalance. Given predominant PN symptoms will start amvuttra, discussed reasoning and plan with patient and pharmacy. - Start amvuttra  I spent 50 minutes caring for this patient today including face to face time, ordering and reviewing labs, reviewing records from multiple imaging studies, seeing the patient, documenting in the record, and arranging follow ups.   Follow up in 3 months  Morene Brownie, MD Advanced Heart Failure Mechanical Circulatory Support 11/17/23

## 2023-11-17 NOTE — Patient Instructions (Signed)
 You will be called about amvuttra once it gets approved.  Your physician recommends that you schedule a follow-up appointment in: 3 months ( October) ** PLEASE CALL THE OFFICE IN SEPTEMBER TO ARRANGE YOUR FOLLOW UP APPOINTMENT.**  If you have any questions or concerns before your next appointment please send us  a message through [facility] or call our office at [phone].    TO LEAVE A MESSAGE FOR THE NURSE SELECT OPTION 2, PLEASE LEAVE A MESSAGE INCLUDING: YOUR NAME DATE OF BIRTH CALL BACK NUMBER REASON FOR CALL**this is important as we prioritize the call backs  YOU WILL RECEIVE A CALL BACK THE SAME DAY AS LONG AS YOU CALL BEFORE 4:00 PM  At the Advanced [facility] Failure Clinic, you and your health needs are our priority. As part of our continuing mission to provide you with exceptional heart care, we have created designated Provider Care Teams. These Care Teams include your primary Cardiologist (physician) and Advanced Practice Providers (APPs- Physician Assistants and Nurse Practitioners) who all work together to provide you with the care you need, when you need it.   You may see any of the following providers on your designated Care Team at your next follow up: Dr Toribio Fuel Dr Ezra Shuck Dr. Ria Commander Dr. Morene Brownie Amy Lenetta, NP Caffie Shed, [place] [facility] [place], [place] Beckey Coe, NP [place] Lee, NP Ellouise Class, NP Tinnie Redman, PharmD Jaun Bash, PharmD   Please be sure to bring in all your medications bottles to every appointment.    Thank you for choosing [facility]

## 2023-11-17 NOTE — Telephone Encounter (Signed)
 Patient Advocate Encounter   Attempted to submit PA for Amvuttra through pharmacy benefit on CMM (key [id number] ). Received the following rejection notice:   The requested medication is not covered under the [employer] formulary. These non-covered drugs have available covered options in the same therapeutic class which are listed along with information on the formulary exception process for Standard Option online: [email or web address]/[email or web address]/[email or web address]_Standard_Option_Excluded_Drugs.pdf>   The full approved drug list is available at: [email or web address]://[email or web address]/[email or web address]_drug_list_OE.[email or web address]   Reviewed formulary list provided. No TTR silencers are present on the formulary. This likely means the medication will need to be processed through the medical benefit. Submitted start form to [employer] so full benefits investigation can be completed. Will proceed with PA once completed.  Tinnie Redman, PharmD, BCPS, BCCP, CPP Heart Failure Clinic Pharmacist [phone]

## 2023-11-18 ENCOUNTER — Other Ambulatory Visit (HOSPITAL_COMMUNITY): Payer: Self-pay

## 2023-11-26 ENCOUNTER — Other Ambulatory Visit (HOSPITAL_COMMUNITY): Payer: Self-pay

## 2023-11-27 ENCOUNTER — Ambulatory Visit: Admitting: Neurology

## 2023-11-27 DIAGNOSIS — R202 Paresthesia of skin: Secondary | ICD-10-CM | POA: Diagnosis not present

## 2023-11-27 DIAGNOSIS — R200 Anesthesia of skin: Secondary | ICD-10-CM

## 2023-11-27 DIAGNOSIS — G5603 Carpal tunnel syndrome, bilateral upper limbs: Secondary | ICD-10-CM

## 2023-11-27 NOTE — Procedures (Signed)
 [facility]  [address] [place], [address]  [place], [place] [address] Tel: [phone] Fax: [phone] Test Date:  11/27/2023  Patient: Johnny Howard DOB: 02-02-1946 Physician: Tonita Blanch, DO  Sex: Male Height: 5' 10 Ref Phys: Tonita Blanch, DO  ID#: [id number]   Technician:    History: This is a 78 year old man with hereditary amyloidosis referred for evaluation of bilateral upper extremity paresthesias.  NCV & EMG Findings: Extensive electrodiagnostic testing of the right upper extremity and additional studies of the left shows:  Bilateral median sensory responses are absent.  Bilateral ulnar sensory responses are within normal limits. Bilateral median motor responses show severely prolonged latency (R7.3, L6.9 ms).  Bilateral ulnar motor responses are within normal limits.   There is no evidence of active or chronic motor axonal loss changes affecting any of the tested muscles.  Motor unit configuration and recruitment pattern is within normal limits.  Impression: Bilateral median neuropathy at or distal to the wrist, consistent with a clinical diagnosis of carpal tunnel syndrome.  Overall, these findings are severe in degree electrically.  There is no evidence of a sensorimotor polyneuropathy affecting the upper extremities.   ___________________________ Tonita Blanch, DO    Nerve Conduction Studies   Stim Site NR Peak (ms) Norm Peak (ms) O-P Amp (V) Norm O-P Amp  Left Median Anti Sensory (2nd Digit)  32 C  Wrist *NR  <3.8  >10  Right Median Anti Sensory (2nd Digit)  32 C  Wrist *NR  <3.8  >10  Left Ulnar Anti Sensory (5th Digit)  32 C  Wrist    3.2 <3.2 25.5 >5  Right Ulnar Anti Sensory (5th Digit)  32 C  Wrist    3.1 <3.2 22.7 >5     Stim Site NR Onset (ms) Norm Onset (ms) O-P Amp (mV) Norm O-P Amp Site1 Site2 Delta-0 (ms) Dist (cm) Vel (m/s) Norm Vel (m/s)  Left Median Motor (Abd Poll Brev)  32 C  Wrist    *6.9 <4.0 11.7 >5 Elbow Wrist 6.4 34.0 53  >50  Elbow    13.3  11.7         Right Median Motor (Abd Poll Brev)  32 C  Wrist    *7.3 <4.0 12.1 >5 Elbow Wrist 6.1 33.0 54 >50  Elbow    13.4  11.9         Left Ulnar Motor (Abd Dig Minimi)  32 C  Wrist    2.6 <3.1 14.7 >7 B Elbow Wrist 4.3 25.0 58 >50  B Elbow    6.9  13.4  A Elbow B Elbow 1.7 10.0 59 >50  A Elbow    8.6  13.2         Right Ulnar Motor (Abd Dig Minimi)  32 C  Wrist    2.7 <3.1 15.5 >7 B Elbow Wrist 4.6 27.0 59 >50  B Elbow    7.3  14.5  A Elbow B Elbow 1.8 10.0 56 >50  A Elbow    9.1  13.8          Electromyography   Side Muscle Ins.Act Fibs Fasc Recrt Amp Dur Poly Activation Comment  Right 1stDorInt Nml Nml Nml Nml Nml Nml Nml Nml N/A  Right Abd Poll Brev Nml Nml Nml Nml Nml Nml Nml Nml N/A  Right PronatorTeres Nml Nml Nml Nml Nml Nml Nml Nml N/A  Right Biceps Nml Nml Nml Nml Nml Nml Nml Nml N/A  Right Triceps Nml Nml  Nml Nml Nml Nml Nml Nml N/A  Right Deltoid Nml Nml Nml Nml Nml Nml Nml Nml N/A  Left 1stDorInt Nml Nml Nml Nml Nml Nml Nml Nml N/A  Left Abd Poll Brev Nml Nml Nml Nml Nml Nml Nml Nml N/A  Left PronatorTeres Nml Nml Nml Nml Nml Nml Nml Nml N/A  Left Biceps Nml Nml Nml Nml Nml Nml Nml Nml N/A  Left Triceps Nml Nml Nml Nml Nml Nml Nml Nml N/A  Left Deltoid Nml Nml Nml Nml Nml Nml Nml Nml N/A      Waveforms:

## 2023-12-02 NOTE — Telephone Encounter (Signed)
 Advanced Heart Failure Patient Advocate Encounter  Prior Authorization for Amvuttra  has been approved through patient's [facility] Benefit. Approval has been scanned to media tab on 12/02/23. SABRA    PA# [id number] Effective dates: 12/02/2023 through 11/30/2024  Since this will be billed through the medical benefit, it will be administered by [facility]. Patient will be scheduled after copay program enrollment can be completed.  Tinnie Redman, PharmD, BCPS, BCCP, CPP Heart Failure Clinic Pharmacist [phone]

## 2023-12-07 ENCOUNTER — Telehealth: Payer: Self-pay | Admitting: Pharmacy Technician

## 2023-12-07 NOTE — Telephone Encounter (Signed)
 Auth Submission: APPROVED Site of care: [facility] Payer: BCBS Medication & CPT/J Code(s) submitted: AMVUTTRA Diagnosis Code:  Route of submission (phone, fax, portal): FAXED Phone # Fax # Auth type: Buy/Bill PB Units/visits requested: 25MG  [id number] Reference number: [id number] Approval from: 12/02/23 to 11/30/24   CO-PAY CARD: APPROVED

## 2023-12-14 ENCOUNTER — Other Ambulatory Visit (HOSPITAL_COMMUNITY): Payer: Self-pay | Admitting: Cardiology

## 2023-12-14 DIAGNOSIS — E852 Heredofamilial amyloidosis, unspecified: Secondary | ICD-10-CM

## 2023-12-17 ENCOUNTER — Ambulatory Visit (HOSPITAL_COMMUNITY)
Admission: RE | Admit: 2023-12-17 | Discharge: 2023-12-17 | Disposition: A | Discharge status: Home / Self Care | Source: Ambulatory Visit | Attending: Cardiology | Admitting: Cardiology

## 2023-12-17 DIAGNOSIS — E852 Heredofamilial amyloidosis, unspecified: Secondary | ICD-10-CM | POA: Diagnosis present

## 2023-12-17 MED ORDER — VUTRISIRAN SODIUM 25 MG/0.5ML ~~LOC~~ SOSY
25.0000 mg | PREFILLED_SYRINGE | Freq: Once | SUBCUTANEOUS | Status: AC
  Administered 2023-12-17: 25 mg via SUBCUTANEOUS
  Filled 2023-12-17: qty 0.5

## 2024-01-11 ENCOUNTER — Other Ambulatory Visit: Payer: Self-pay | Admitting: Surgery

## 2024-01-11 DIAGNOSIS — D134 Benign neoplasm of liver: Secondary | ICD-10-CM

## 2024-01-25 ENCOUNTER — Encounter (HOSPITAL_COMMUNITY): Payer: Self-pay | Admitting: Cardiology

## 2024-02-02 ENCOUNTER — Encounter: Payer: Self-pay | Admitting: Surgery

## 2024-02-06 ENCOUNTER — Ambulatory Visit
Admission: RE | Admit: 2024-02-06 | Discharge: 2024-02-06 | Disposition: A | Discharge status: Home / Self Care | Source: Ambulatory Visit | Attending: Surgery | Admitting: Surgery

## 2024-02-06 DIAGNOSIS — D134 Benign neoplasm of liver: Secondary | ICD-10-CM

## 2024-02-06 MED ORDER — GADOPICLENOL 0.5 MMOL/ML IV SOLN
8.0000 mL | Freq: Once | INTRAVENOUS | Status: AC | PRN
  Administered 2024-02-06: 8 mL via INTRAVENOUS

## 2024-03-01 ENCOUNTER — Ambulatory Visit (INDEPENDENT_AMBULATORY_CARE_PROVIDER_SITE_OTHER): Admitting: Neurology

## 2024-03-01 ENCOUNTER — Encounter: Payer: Self-pay | Admitting: Neurology

## 2024-03-01 VITALS — BP 123/73 | HR 88 | Ht 70.5 in | Wt 174.0 lb

## 2024-03-01 DIAGNOSIS — G5603 Carpal tunnel syndrome, bilateral upper limbs: Secondary | ICD-10-CM

## 2024-03-01 DIAGNOSIS — E852 Heredofamilial amyloidosis, unspecified: Secondary | ICD-10-CM

## 2024-03-01 NOTE — Patient Instructions (Signed)
 Please call Dr. Starla office at [phone] to schedule follow-up visit.   Start daily multivitamin

## 2024-03-01 NOTE — Progress Notes (Signed)
 Follow-up Visit   Date: 03/01/2024    Johnny Howard MRN: [id number] DOB: 04-06-1946    Johnny Howard is a 78 y.o. right-handed male with hyperlipidemia, prediabetes, prostate cancer s/p resection and radiation, and cardiac amyloidosis returning to the clinic for follow-up of hereditary amyloidosis with neurological manifestation.  The patient was accompanied to the clinic by self.    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    - Continue to use wrist braces  - Continue Amvuttra  injections every 3 months  - Start daily multivitamin  2.  Vitamin B1 deficiency  - Continue vitamin B1 100mg   Return to clinic in 6 months  --------------------------------------------- History of present illness: For the past year, he has been waking up with his arms and hands falling asleep.  It lasts about 15-30 min and then improves.  He has tried soaking the hands in warm water, shaking the hands, and repositioning.  He denies hand weakness, neck pain, or radicular arm pain.  He does not have numbness/tingling of the feet, imbalance, leg weakness, or history of falls.    Nonsmoker.  He drinks a glass of wine 3-4 times per week.  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  He lives at home with wife.  He has two grown daughters.  His younger sister has carpal tunnel syndrome.  Father died of CHF at the age of almost 2.  UPDATE 03/01/2024:    He is here for follow-up for bilateral hand tingling.  NCS/EMG confirmed severe bilateral CTS.  Genetic testing also returned positive for TTR amyloidosis.  He was initially using wrist braces for CTS.  He continues to have daily spells of hand numbness/tingling which lasts 10 min at night time.  Symptoms improve with repositioning.  He has been taking vitamin B1 supplements after labs show vitamin B1 deficiency and  feels that the frequency of hand numbness is less.    He started Amvuttra  in August and is awaiting appointment for the next injection.  He is not taking daily multivitamin.  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   Medications:  Current Outpatient Medications on File Prior to Visit  Medication Sig Dispense Refill   atorvastatin (LIPITOR) 40 MG tablet Take 40 mg by mouth daily.  5   finasteride (PROSCAR) 5 MG tablet Take 5 mg by mouth daily.     psyllium (METAMUCIL) 58.6 % packet Take 1 packet by mouth daily. 3  heaping teaspooons daily     sildenafil (VIAGRA) 50 MG tablet 60MG  1 tablet as needed     vutrisiran  sodium (AMVUTTRA ) 25 MG/0.5ML syringe Inject 25 mg into the skin every 3 (three) months.     No current facility-administered medications on file prior to visit.    Allergies: No Known Allergies  Vital Signs:  BP 123/73   Pulse 88   Ht 5' 10.5 (1.791 m)   Wt 174 lb (78.9 kg)   SpO2 99%   BMI 24.61 kg/m   Neurological Exam: MENTAL STATUS including orientation to time, place, person, recent and remote memory, attention span and concentration, language, and fund of knowledge is normal.  Speech is not dysarthric.  CRANIAL NERVES:  No visual field defects.  Pupils equal round and reactive to light.  Normal conjugate,  extra-ocular eye movements in all directions of gaze.  No ptosis .  Face is symmetric. Palate elevates symmetrically.  Tongue is midline.  MOTOR:  Motor strength is 5/5 in all extremities.  No atrophy, fasciculations or abnormal movements.  No pronator drift.  Tone is normal.    MSRs:  Reflexes are 2+/4 throughout.  SENSORY:  Intact to vibration throughout.  COORDINATION/GAIT:  Normal finger-to- nose-finger.  Intact rapid alternating movements bilaterally.  Gait narrow based and stable.   Data: NCS/EMG of the arms 11/27/2023: Bilateral median  neuropathy at or distal to the wrist, consistent with a clinical diagnosis of carpal tunnel syndrome.  Overall, these findings are severe in degree electrically.  There is no evidence of a sensorimotor polyneuropathy affecting the upper extremities.   Thank you for allowing me to participate in patient's care.  If I can answer any additional questions, I would be pleased to do so.    Sincerely,    Claudius Mich K. Tobie, DO

## 2024-03-04 ENCOUNTER — Encounter (HOSPITAL_COMMUNITY): Payer: Self-pay | Admitting: Cardiology

## 2024-03-04 ENCOUNTER — Other Ambulatory Visit (HOSPITAL_COMMUNITY): Payer: Self-pay | Admitting: Cardiology

## 2024-03-04 DIAGNOSIS — E854 Organ-limited amyloidosis: Secondary | ICD-10-CM | POA: Insufficient documentation

## 2024-03-15 ENCOUNTER — Other Ambulatory Visit (HOSPITAL_COMMUNITY): Payer: Self-pay

## 2024-03-15 ENCOUNTER — Ambulatory Visit (HOSPITAL_COMMUNITY)
Admission: RE | Admit: 2024-03-15 | Discharge: 2024-03-15 | Disposition: A | Discharge status: Home / Self Care | Source: Ambulatory Visit | Attending: Cardiology | Admitting: Cardiology

## 2024-03-15 ENCOUNTER — Encounter (HOSPITAL_COMMUNITY): Payer: Self-pay | Admitting: Cardiology

## 2024-03-15 ENCOUNTER — Telehealth (HOSPITAL_COMMUNITY): Payer: Self-pay

## 2024-03-15 VITALS — BP 114/74 | HR 92 | Wt 171.4 lb

## 2024-03-15 DIAGNOSIS — E785 Hyperlipidemia, unspecified: Secondary | ICD-10-CM | POA: Insufficient documentation

## 2024-03-15 DIAGNOSIS — E854 Organ-limited amyloidosis: Secondary | ICD-10-CM | POA: Diagnosis present

## 2024-03-15 DIAGNOSIS — I43 Cardiomyopathy in diseases classified elsewhere: Secondary | ICD-10-CM | POA: Insufficient documentation

## 2024-03-15 DIAGNOSIS — N1831 Chronic kidney disease, stage 3a: Secondary | ICD-10-CM | POA: Insufficient documentation

## 2024-03-15 DIAGNOSIS — Z79899 Other long term (current) drug therapy: Secondary | ICD-10-CM | POA: Insufficient documentation

## 2024-03-15 DIAGNOSIS — G629 Polyneuropathy, unspecified: Secondary | ICD-10-CM | POA: Insufficient documentation

## 2024-03-15 LAB — BASIC METABOLIC PANEL WITH GFR
Anion gap: 10 (ref 5–15)
BUN: 26 mg/dL — ABNORMAL HIGH (ref 8–23)
CO2: 21 mmol/L — ABNORMAL LOW (ref 22–32)
Calcium: 9.9 mg/dL (ref 8.9–10.3)
Chloride: 108 mmol/L (ref 98–111)
Creatinine, Ser: 1.49 mg/dL — ABNORMAL HIGH (ref 0.61–1.24)
GFR, Estimated: 47 mL/min — ABNORMAL LOW (ref >60)
Glucose, Bld: 85 mg/dL (ref 70–99)
Potassium: 4.5 mmol/L (ref 3.5–5.1)
Sodium: 139 mmol/L (ref 135–145)

## 2024-03-15 LAB — BRAIN NATRIURETIC PEPTIDE: B Natriuretic Peptide: 187.4 pg/mL — ABNORMAL HIGH (ref 0.0–100.0)

## 2024-03-15 MED ORDER — DAPAGLIFLOZIN PROPANEDIOL 10 MG PO TABS
10.0000 mg | ORAL_TABLET | Freq: Every day | ORAL | 11 refills | Status: AC
Start: 2024-03-15 — End: ?

## 2024-03-15 NOTE — Patient Instructions (Addendum)
 Labs done today. We will contact you only if your labs are abnormal.  START Farxiga  10mg  (1 tablet) by mouth daily.   No other medication changes were made. Please continue all current medications as prescribed.  You have been ordered a PYP Scan.  This is done in the Radiology Department of [facility].  When you come for this test please plan to be there 2-3 hours. That department will contact you to schedule an appointment.   Your physician recommends that you schedule a follow-up appointment in: 6 months with Dr. Zenaida. Please contact our office in April 2026 to schedule a May 2026 appointment.   If you have any questions or concerns before your next appointment please send us  a message through [place] or call our office at [phone].    TO LEAVE A MESSAGE FOR THE NURSE SELECT OPTION 2, PLEASE LEAVE A MESSAGE INCLUDING: YOUR NAME DATE OF BIRTH CALL BACK NUMBER REASON FOR CALL**this is important as we prioritize the call backs  YOU WILL RECEIVE A CALL BACK THE SAME DAY AS LONG AS YOU CALL BEFORE 4:00 PM   Do the following things EVERYDAY: Weigh yourself in the morning before breakfast. Write it down and keep it in a log. Take your medicines as prescribed Eat low salt foods--Limit salt (sodium) to 2000 mg per day.  Stay as active as you can everyday Limit all fluids for the day to less than 2 liters   At the [facility], you and your health needs are our priority. As part of our continuing mission to provide you with exceptional heart care, we have created designated Provider Care Teams. These Care Teams include your primary Cardiologist (physician) and Advanced Practice Providers (APPs- Physician Assistants and Nurse Practitioners) who all work together to provide you with the care you need, when you need it.   You may see any of the following providers on your designated Care Team at your next follow up: Dr Toribio Fuel Dr Ezra Shuck Dr.  Morene Zenaida Greig Mosses, NP Caffie Shed, [place] [facility] [place], [place] Beckey Coe, NP Jordan Lee, NP Ellouise Class, NP Tinnie Redman, PharmD Jaun Bash, PharmD   Please be sure to bring in all your medications bottles to every appointment.    Thank you for choosing [facility]

## 2024-03-15 NOTE — Telephone Encounter (Signed)
 [facility] Patient Advocate Encounter  Prior authorization for Farxiga  has been submitted and approved. Test billing returns $0 for 90 day supply.  Key: [id number] Effective: 03/15/2024 to 03/15/2025  Rachel DEL, CPhT Rx Patient Advocate Phone: [phone]

## 2024-03-15 NOTE — Progress Notes (Signed)
   ADVANCED HEART FAILURE FOLLOW UP CLINIC NOTE  Referring Physician: Valma Carwin, MD  Primary Care: Valma Carwin, MD Primary Cardiologist:  HPI: Johnny Howard is a 78 y.o. 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     Retired public affairs consultant. Overall minimal medical history, Originally seen for abnormal EKG, with excellent workup by Dr. [place] underwent cardiac MRI showing significant concern for cardiac amyloid.      SUBJECTIVE: No significant complaints today. Some mild improvement in the frequency of his neuropathy, he has symptoms much less often. Otherwise doing very well, no PND, orthopnea, chest pain, lower extremity swelling.   PMH, current medications, allergies, social history, and family history reviewed in epic.  PHYSICAL EXAM: Vitals:   03/15/24 1453  BP: 114/74  Pulse: 92  SpO2: 97%    GENERAL: NAD, well appearing PULM:  Normal work of breathing, CTAB CARDIAC:  JVP: flat         Normal rate with regular rhythm. No murmurs, rubs or gallops.  No edema. Warm and well perfused extremities. ABDOMEN: Soft, non-tender, non-distended. NEUROLOGIC: Patient is oriented x3 with no focal or lateralizing neurologic deficits.     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 NYHA class I symptoms from a cardiac standpoint, on amvuttra  for predominant PN. Discussed starting farxiga  today for HF prevention/CKD benefit. - Start farxiga  10mg  daily - Continue amvuttra ,  Vitamin A supplementation - Some itching with last injection, monitor subsequent injections  CKD: Noted previously, does not follow with nephrology.  - Stage IIIa - Start farxiga  as above   Follow up in 6 months  Morene Brownie, MD [facility] Mechanical Circulatory Support 03/20/24

## 2024-03-22 ENCOUNTER — Ambulatory Visit (HOSPITAL_COMMUNITY): Payer: Self-pay | Admitting: Cardiology

## 2024-03-22 ENCOUNTER — Ambulatory Visit (HOSPITAL_COMMUNITY)
Admission: RE | Admit: 2024-03-22 | Discharge: 2024-03-22 | Disposition: A | Source: Ambulatory Visit | Attending: Cardiology

## 2024-03-22 VITALS — BP 151/88 | HR 88

## 2024-03-22 DIAGNOSIS — E854 Organ-limited amyloidosis: Secondary | ICD-10-CM | POA: Insufficient documentation

## 2024-03-22 DIAGNOSIS — I43 Cardiomyopathy in diseases classified elsewhere: Secondary | ICD-10-CM | POA: Diagnosis present

## 2024-03-22 MED ORDER — VUTRISIRAN SODIUM 25 MG/0.5ML ~~LOC~~ SOSY
25.0000 mg | PREFILLED_SYRINGE | Freq: Once | SUBCUTANEOUS | Status: AC
  Administered 2024-03-22: 25 mg via SUBCUTANEOUS
  Filled 2024-03-22: qty 0.5

## 2024-03-28 ENCOUNTER — Telehealth (HOSPITAL_COMMUNITY): Payer: Self-pay

## 2024-03-28 ENCOUNTER — Other Ambulatory Visit (HOSPITAL_COMMUNITY): Payer: Self-pay | Admitting: Cardiology

## 2024-03-28 DIAGNOSIS — I43 Cardiomyopathy in diseases classified elsewhere: Secondary | ICD-10-CM

## 2024-03-28 NOTE — Telephone Encounter (Signed)
 Spoke with the patient, detailed instructions given. Johnny Howard CCT

## 2024-03-29 ENCOUNTER — Inpatient Hospital Stay (HOSPITAL_COMMUNITY): Admission: RE | Admit: 2024-03-29 | Discharge: 2024-03-29 | Attending: Cardiology | Admitting: Cardiology

## 2024-03-29 DIAGNOSIS — E854 Organ-limited amyloidosis: Secondary | ICD-10-CM

## 2024-03-29 MED ORDER — TC 99M HYDROXYMETHYLENE DIPHOSPHONATE INJECTION
21.9000 | Freq: Once | INTRAVENOUS | Status: AC
  Administered 2024-03-29: 21.9 via INTRAVENOUS

## 2024-03-30 ENCOUNTER — Ambulatory Visit (HOSPITAL_COMMUNITY): Payer: Self-pay | Admitting: Cardiology

## 2024-06-20 ENCOUNTER — Encounter (HOSPITAL_COMMUNITY)

## 2024-08-30 ENCOUNTER — Ambulatory Visit: Admitting: Neurology
